# Patient Record
Sex: Male | Born: 1935 | Race: White | Hispanic: No | Marital: Married | Smoking: Former smoker
Health system: Southern US, Community
[De-identification: ages and names within clinical notes are randomized; demographics above are authoritative.]

## PROBLEM LIST (undated history)

## (undated) DIAGNOSIS — I10 Essential (primary) hypertension: Secondary | ICD-10-CM

## (undated) DIAGNOSIS — E785 Hyperlipidemia, unspecified: Secondary | ICD-10-CM

## (undated) HISTORY — DX: Hyperlipidemia, unspecified: E78.5

## (undated) HISTORY — DX: Essential (primary) hypertension: I10

## (undated) HISTORY — PX: SHOULDER SURGERY: SHX246

---

## 2011-10-28 DIAGNOSIS — K52832 Lymphocytic colitis: Secondary | ICD-10-CM

## 2011-10-28 HISTORY — DX: Lymphocytic colitis: K52.832

## 2014-07-07 DIAGNOSIS — M19019 Primary osteoarthritis, unspecified shoulder: Secondary | ICD-10-CM

## 2014-07-07 HISTORY — DX: Primary osteoarthritis, unspecified shoulder: M19.019

## 2015-05-16 DIAGNOSIS — I2510 Atherosclerotic heart disease of native coronary artery without angina pectoris: Secondary | ICD-10-CM | POA: Insufficient documentation

## 2015-05-16 HISTORY — DX: Atherosclerotic heart disease of native coronary artery without angina pectoris: I25.10

## 2015-05-17 DIAGNOSIS — E785 Hyperlipidemia, unspecified: Secondary | ICD-10-CM | POA: Insufficient documentation

## 2015-05-17 DIAGNOSIS — I10 Essential (primary) hypertension: Secondary | ICD-10-CM

## 2015-05-17 DIAGNOSIS — I739 Peripheral vascular disease, unspecified: Secondary | ICD-10-CM | POA: Insufficient documentation

## 2015-05-17 DIAGNOSIS — I714 Abdominal aortic aneurysm, without rupture, unspecified: Secondary | ICD-10-CM | POA: Insufficient documentation

## 2015-05-17 HISTORY — DX: Essential (primary) hypertension: I10

## 2015-05-17 HISTORY — DX: Abdominal aortic aneurysm, without rupture: I71.4

## 2015-05-17 HISTORY — DX: Peripheral vascular disease, unspecified: I73.9

## 2015-05-17 HISTORY — DX: Hyperlipidemia, unspecified: E78.5

## 2015-05-17 HISTORY — DX: Abdominal aortic aneurysm, without rupture, unspecified: I71.40

## 2015-11-01 DIAGNOSIS — Z860100 Personal history of colon polyps, unspecified: Secondary | ICD-10-CM | POA: Insufficient documentation

## 2015-11-01 DIAGNOSIS — K5909 Other constipation: Secondary | ICD-10-CM

## 2015-11-01 HISTORY — DX: Other constipation: K59.09

## 2015-11-01 HISTORY — DX: Personal history of colon polyps, unspecified: Z86.0100

## 2015-11-01 HISTORY — DX: Personal history of colonic polyps: Z86.010

## 2016-10-25 DIAGNOSIS — K219 Gastro-esophageal reflux disease without esophagitis: Secondary | ICD-10-CM

## 2016-10-25 DIAGNOSIS — A419 Sepsis, unspecified organism: Secondary | ICD-10-CM

## 2016-10-25 HISTORY — DX: Sepsis, unspecified organism: A41.9

## 2016-10-25 HISTORY — DX: Gastro-esophageal reflux disease without esophagitis: K21.9

## 2017-04-27 NOTE — Progress Notes (Signed)
u

## 2017-04-30 ENCOUNTER — Encounter: Payer: Self-pay | Admitting: Cardiology

## 2017-04-30 ENCOUNTER — Ambulatory Visit (INDEPENDENT_AMBULATORY_CARE_PROVIDER_SITE_OTHER): Payer: Medicare Other | Admitting: Cardiology

## 2017-04-30 VITALS — BP 140/70 | HR 72 | Resp 10 | Ht 68.0 in | Wt 177.8 lb

## 2017-04-30 DIAGNOSIS — I10 Essential (primary) hypertension: Secondary | ICD-10-CM

## 2017-04-30 DIAGNOSIS — I2510 Atherosclerotic heart disease of native coronary artery without angina pectoris: Secondary | ICD-10-CM | POA: Diagnosis not present

## 2017-04-30 DIAGNOSIS — I714 Abdominal aortic aneurysm, without rupture, unspecified: Secondary | ICD-10-CM

## 2017-04-30 DIAGNOSIS — I739 Peripheral vascular disease, unspecified: Secondary | ICD-10-CM | POA: Diagnosis not present

## 2017-04-30 DIAGNOSIS — E785 Hyperlipidemia, unspecified: Secondary | ICD-10-CM

## 2017-04-30 MED ORDER — ATORVASTATIN CALCIUM 20 MG PO TABS: 20.0000 mg | ORAL_TABLET | Freq: Every day | ORAL | 3 refills | Status: DC

## 2017-04-30 MED ORDER — CARVEDILOL 3.125 MG PO TABS: 6.2500 mg | ORAL_TABLET | Freq: Two times a day (BID) | ORAL | 3 refills | Status: DC

## 2017-04-30 MED ORDER — CARVEDILOL 6.25 MG PO TABS: 6.2500 mg | ORAL_TABLET | Freq: Two times a day (BID) | ORAL | 3 refills | Status: DC

## 2017-04-30 NOTE — Progress Notes (Signed)
Cardiology Office Note:    Date:  04/30/2017   ID:  Conrad [place], DOB 1933/09/12, MRN [id number]  PCP:  Andreas Blower., MD  Cardiologist:  Gypsy Balsam, MD    Referring MD: No ref. provider found   Chief Complaint  Patient presents with  . Follow-up  Coronary artery disease  History of Present Illness:    Stanley Cain is a 83 y.o. male  with coronary artery disease. He does have a constellation of her atypical symptoms again. Recently he did have a stress test which was normal. Quite well on the treadmill. Complaint of having fatigue and tiredness. He thinks is related to his medications. He already stopped Crestor recently feels better. He also takes metoprolol treated problem. I decided to switch Toprol to carvedilol 3.125 twice daily, will also switch his present Lipitor. I will see him back in my office in about 2 months to see how he does.  Past Medical History:  Diagnosis Date  . Hyperlipidemia   . Hypertension     Past Surgical History:  Procedure Laterality Date  . SHOULDER SURGERY      Current Medications: Current Meds  Medication Sig  . allopurinol (ZYLOPRIM) 100 MG tablet Take 100 mg by mouth.  Marland Kitchen aspirin EC 81 MG tablet Take 81 mg by mouth.  . clopidogrel (PLAVIX) 75 MG tablet Take 75 mg by mouth.  . clotrimazole-betamethasone (LOTRISONE) cream Apply topically.  . nitroGLYCERIN (NITROSTAT) 0.4 MG SL tablet Place 0.4 mg under the tongue.  . pantoprazole (PROTONIX) 40 MG tablet Take 1 tablet by mouth  daily  . ramipril (ALTACE) 5 MG capsule Take 1 capsule by mouth  daily  . rosuvastatin (CRESTOR) 20 MG tablet Take 20 mg by mouth.     Allergies:   Ciprofloxacin; Enalapril; Metaxalone; Niacin; and Penicillins   Social History   Social History  . Marital status: Married    Spouse name: N/A  . Number of children: N/A  . Years of education: N/A   Social History Main Topics  . Smoking status: Former Games developer  . Smokeless tobacco: Never Used  .  Alcohol use Yes  . Drug use: No  . Sexual activity: Not Asked   Other Topics Concern  . None   Social History Narrative  . None     Family History: The patient's family history includes Cancer in his father. ROS:   Please see the history of present illness.     All other systems reviewed and are negative.  EKGs/Labs/Other Studies Reviewed:      Recent Labs: No results found for requested labs within last 8760 hours.  Recent Lipid Panel No results found for: CHOL, TRIG, HDL, CHOLHDL, VLDL, LDLCALC, LDLDIRECT  Physical Exam:    VS:  BP 140/70   Pulse 72   Resp 10   Ht 5\' 8"  (1.727 m)   Wt 177 lb 12.8 oz (80.6 kg)   BMI 27.03 kg/m     Wt Readings from Last 3 Encounters:  04/30/17 177 lb 12.8 oz (80.6 kg)     GEN:  Well nourished, well developed in no acute distress HEENT: Normal NECK: No JVD; No carotid bruits LYMPHATICS: No lymphadenopathy CARDIAC: RRR, no murmurs, no rubs, no gallops RESPIRATORY:  Clear to auscultation without rales, wheezing or rhonchi  ABDOMEN: Soft, non-tender, non-distended MUSCULOSKELETAL:  No edema; No deformity  SKIN: Warm and dry LOWER EXTREMITIES: no swelling NEUROLOGIC:  Alert and oriented x 3 PSYCHIATRIC:  Normal affect   ASSESSMENT:  1. Abdominal aortic aneurysm (AAA), 30-34 mm diameter (HCC)   2. Coronary artery stenosis   3. Essential hypertension   4. Peripheral vascular disease (HCC)   5. Dyslipidemia    PLAN:    In order of problems listed above:  1. Coronary artery disease: Stable, recent stresses otherwise negative for exercise-induced myocardial ischemia these exercise loss. 2. Essential hypertension, he requested switching him from metoprolol to something different. I will put her vertigo 3.5 twice daily and watch his blood pressure. 3. Dyslipidemia: Discussion as above he wants to switch from present Lipitor which I will do.   Medication Adjustments/Labs and Tests Ordered: Current medicines are reviewed at  length with the patient today.  Concerns regarding medicines are outlined above.  No orders of the defined types were placed in this encounter.  Medication changes: No orders of the defined types were placed in this encounter.   Signed, Gypsy Balsamobert Reah Justo, MD  04/30/2017 8:54 AM    [facility]

## 2017-04-30 NOTE — Patient Instructions (Signed)
Medication Instructions:  Your physician has recommended you make the following change in your medication: Please stop the metoprolol and Crestor. Dr. Bing MatterKrasowski would like you to start carvedilol 3.125 mg twice daily and Lipitor 20 mg daily.    Labwork: None   Testing/Procedures: None   Follow-Up: Your physician recommends that you schedule a follow-up appointment in: 2 months   Any Other Special Instructions Will Be Listed Below (If Applicable).     If you need a refill on your cardiac medications before your next appointment, please call your pharmacy.

## 2017-06-02 ENCOUNTER — Ambulatory Visit (INDEPENDENT_AMBULATORY_CARE_PROVIDER_SITE_OTHER): Payer: Medicare Other | Admitting: Cardiology

## 2017-06-02 ENCOUNTER — Encounter: Payer: Self-pay | Admitting: Cardiology

## 2017-06-02 VITALS — BP 136/64 | HR 60 | Resp 12 | Ht 68.0 in | Wt 178.1 lb

## 2017-06-02 DIAGNOSIS — I714 Abdominal aortic aneurysm, without rupture, unspecified: Secondary | ICD-10-CM

## 2017-06-02 DIAGNOSIS — E785 Hyperlipidemia, unspecified: Secondary | ICD-10-CM | POA: Diagnosis not present

## 2017-06-02 DIAGNOSIS — I2510 Atherosclerotic heart disease of native coronary artery without angina pectoris: Secondary | ICD-10-CM | POA: Diagnosis not present

## 2017-06-02 DIAGNOSIS — I10 Essential (primary) hypertension: Secondary | ICD-10-CM

## 2017-06-02 NOTE — Patient Instructions (Addendum)
Medication Instructions:  Your physician recommends that you continue on your current medications as directed. Please refer to the Current Medication list given to you today.  Labwork: CBC, CMP, Sed-Rate, TSH, Vitamin B level. Dr. Bing MatterKrasowski wants to wait to check your cholesterol.   Testing/Procedures: None   Follow-Up: Your physician recommends that you schedule a follow-up appointment in: 1 month   Any Other Special Instructions Will Be Listed Below (If Applicable).     If you need a refill on your cardiac medications before your next appointment, please call your pharmacy.

## 2017-06-02 NOTE — Progress Notes (Signed)
Cardiology Office Note:    Date:  06/02/2017   ID:  Stanley Cain, DOB Sep 19, 1933, MRN [id number]  PCP:  Andreas Blowerabeza, Yuri M., MD  Cardiologist:  Gypsy Balsamobert Nanea Jared, MD    Referring MD: Andreas Blowerabeza, Yuri M., MD   Chief Complaint  Patient presents with  . 1 month follow up  I'm feeling weak and tired  History of Present Illness:    Stanley BurlingtonJerzy Roberge is a 83 y.o. male  with coronary artery disease. Recently chief complaint is being fatigue and tiredness. We tried to make some changes in his medication but that seems not to be helping. I switched him to different beta blocker however by mistake he was given wrong dose he was taking carvedilol 25 mg twice daily. He felt very weak and tired and eventually ended up switching to all the 25 mg at evening time. He also hold his statin which did not want improvement. I will contact his primary care physician tried to see what laboratory tests he had done. We may need to go back to basics checking for him for anemia, kidney function test liver function test as well as thyroid. I will also ask him to start taking all base at evening time) and in the morning.  Past Medical History:  Diagnosis Date  . Hyperlipidemia   . Hypertension     Past Surgical History:  Procedure Laterality Date  . SHOULDER SURGERY      Current Medications: Current Meds  Medication Sig  . allopurinol (ZYLOPRIM) 100 MG tablet Take 100 mg by mouth.  Marland Kitchen. aspirin EC 81 MG tablet Take 81 mg by mouth.  Marland Kitchen. atorvastatin (LIPITOR) 20 MG tablet Take 1 tablet (20 mg total) by mouth daily.  . clopidogrel (PLAVIX) 75 MG tablet Take 75 mg by mouth.  . clotrimazole-betamethasone (LOTRISONE) cream Apply topically.  . nitroGLYCERIN (NITROSTAT) 0.4 MG SL tablet Place 0.4 mg under the tongue.  . pantoprazole (PROTONIX) 40 MG tablet Take 1 tablet by mouth  daily  . ramipril (ALTACE) 5 MG capsule Take 1 capsule by mouth  daily     Allergies:   Ciprofloxacin; Enalapril; Metaxalone; Niacin; and  Penicillins   Social History   Social History  . Marital status: Married    Spouse name: N/A  . Number of children: N/A  . Years of education: N/A   Social History Main Topics  . Smoking status: Former Games developermoker  . Smokeless tobacco: Never Used  . Alcohol use Yes  . Drug use: No  . Sexual activity: Not Asked   Other Topics Concern  . None   Social History Narrative  . None     Family History: The patient's family history includes Cancer in his father. ROS:   Please see the history of present illness.    All 14 point review of systems negative except as described per history of present illness  EKGs/Labs/Other Studies Reviewed:      Recent Labs: No results found for requested labs within last 8760 hours.  Recent Lipid Panel No results found for: CHOL, TRIG, HDL, CHOLHDL, VLDL, LDLCALC, LDLDIRECT  Physical Exam:    VS:  BP 136/64   Pulse 60   Resp 12   Ht 5\' 8"  (1.727 m)   Wt 178 lb 1.9 oz (80.8 kg)   BMI 27.08 kg/m     Wt Readings from Last 3 Encounters:  06/02/17 178 lb 1.9 oz (80.8 kg)  04/30/17 177 lb 12.8 oz (80.6 kg)     GEN:  Well nourished, well developed in no acute distress HEENT: Normal NECK: No JVD; No carotid bruits LYMPHATICS: No lymphadenopathy CARDIAC: RRR, no murmurs, no rubs, no gallops RESPIRATORY:  Clear to auscultation without rales, wheezing or rhonchi  ABDOMEN: Soft, non-tender, non-distended MUSCULOSKELETAL:  No edema; No deformity  SKIN: Warm and dry LOWER EXTREMITIES: no swelling NEUROLOGIC:  Alert and oriented x 3 PSYCHIATRIC:  Normal affect   ASSESSMENT:    1. Coronary artery stenosis   2. Abdominal aortic aneurysm (AAA), 30-34 mm diameter (HCC)   3. Essential hypertension   4. Dyslipidemia    PLAN:    In order of problems listed above:  1. Coronary artery disease: Last stress test reviewed which was negative for exercise-induced ischemia. 2. Abdominal aneurysm: Stable. 3. Essential hypertension: Well-controlled  continue present management. 4. Dyslipidemia: Resume statin. 5. Weakness and fatigue: Plan as outlined above.   Medication Adjustments/Labs and Tests Ordered: Current medicines are reviewed at length with the patient today.  Concerns regarding medicines are outlined above.  No orders of the defined types were placed in this encounter.  Medication changes: No orders of the defined types were placed in this encounter.   Signed, Georgeanna Lea, MD, [facility] 06/02/2017 10:33 AM    [facility]

## 2017-06-03 ENCOUNTER — Telehealth: Payer: Self-pay

## 2017-06-03 ENCOUNTER — Other Ambulatory Visit: Payer: Self-pay

## 2017-06-03 LAB — CBC WITH DIFFERENTIAL/PLATELET
Basophils Absolute: 0.1 10*3/uL (ref 0.0–0.2)
Basos: 1 %
EOS (ABSOLUTE): 0.4 10*3/uL (ref 0.0–0.4)
Eos: 5 %
Hematocrit: 41.2 % (ref 37.5–51.0)
Hemoglobin: 13.7 g/dL (ref 13.0–17.7)
Immature Grans (Abs): 0 10*3/uL (ref 0.0–0.1)
Immature Granulocytes: 0 %
Lymphocytes Absolute: 2.4 10*3/uL (ref 0.7–3.1)
Lymphs: 29 %
MCH: 29.4 pg (ref 26.6–33.0)
MCHC: 33.3 g/dL (ref 31.5–35.7)
MCV: 88 fL (ref 79–97)
Monocytes Absolute: 0.6 10*3/uL (ref 0.1–0.9)
Monocytes: 8 %
Neutrophils Absolute: 4.7 10*3/uL (ref 1.4–7.0)
Neutrophils: 57 %
Platelets: 231 10*3/uL (ref 150–379)
RBC: 4.66 x10E6/uL (ref 4.14–5.80)
RDW: 13.6 % (ref 12.3–15.4)
WBC: 8.2 10*3/uL (ref 3.4–10.8)

## 2017-06-03 LAB — COMPREHENSIVE METABOLIC PANEL WITH GFR
ALT: 14 [IU]/L (ref 0–44)
AST: 16 [IU]/L (ref 0–40)
Albumin/Globulin Ratio: 1.8 (ref 1.2–2.2)
Albumin: 4.4 g/dL (ref 3.5–4.7)
Alkaline Phosphatase: 83 [IU]/L (ref 39–117)
BUN/Creatinine Ratio: 16 (ref 10–24)
BUN: 19 mg/dL (ref 8–27)
Bilirubin Total: 0.4 mg/dL (ref 0.0–1.2)
CO2: 24 mmol/L (ref 20–29)
Calcium: 9.7 mg/dL (ref 8.6–10.2)
Chloride: 101 mmol/L (ref 96–106)
Creatinine, Ser: 1.19 mg/dL (ref 0.76–1.27)
GFR calc Af Amer: 65 mL/min/{1.73_m2}
GFR calc non Af Amer: 57 mL/min/{1.73_m2} — ABNORMAL LOW
Globulin, Total: 2.5 g/dL (ref 1.5–4.5)
Glucose: 101 mg/dL — ABNORMAL HIGH (ref 65–99)
Potassium: 5 mmol/L (ref 3.5–5.2)
Sodium: 139 mmol/L (ref 134–144)
Total Protein: 6.9 g/dL (ref 6.0–8.5)

## 2017-06-03 LAB — VITAMIN B12: Vitamin B-12: 318 pg/mL (ref 232–1245)

## 2017-06-03 LAB — TSH: TSH: 2.07 u[IU]/mL (ref 0.450–4.500)

## 2017-06-03 LAB — SEDIMENTATION RATE: SED RATE: 3 mm/h (ref 0–30)

## 2017-06-03 LAB — VITAMIN D 25 HYDROXY (VIT D DEFICIENCY, FRACTURES): Vit D, 25-Hydroxy: 29.5 ng/mL — ABNORMAL LOW (ref 30.0–100.0)

## 2017-06-03 MED ORDER — VITAMIN D (ERGOCALCIFEROL) 1.25 MG (50000 UNIT) PO CAPS
50000.0000 [IU] | ORAL_CAPSULE | ORAL | 0 refills | Status: DC
Start: 2017-06-03 — End: 2019-12-15

## 2017-06-03 MED ORDER — VITAMIN D 50 MCG (2000 UT) PO CAPS: 2000.0000 [IU] | ORAL_CAPSULE | Freq: Every day | ORAL | 6 refills | Status: DC

## 2017-06-03 NOTE — Telephone Encounter (Signed)
S.w pt to advised of the results of test. Advised of medication start as well. Pt will call Dr. Bing MatterKrasowski for further clarification if needed.

## 2017-06-03 NOTE — Telephone Encounter (Signed)
-----   Message from Georgeanna Leaobert J Krasowski, MD sent at 06/03/2017 11:35 AM EDT ----- All labs are fine, but is D3 is low, start 50 000U po q week, after that 2000U a day

## 2017-06-08 ENCOUNTER — Telehealth: Payer: Self-pay

## 2017-06-08 DIAGNOSIS — R110 Nausea: Secondary | ICD-10-CM

## 2017-06-08 DIAGNOSIS — R140 Abdominal distension (gaseous): Secondary | ICD-10-CM

## 2017-06-08 NOTE — Telephone Encounter (Signed)
Message left for GI to discuss sooner appointment for pt.

## 2017-07-03 ENCOUNTER — Ambulatory Visit: Payer: Medicare Other | Admitting: Cardiology

## 2017-07-07 ENCOUNTER — Encounter: Payer: Self-pay | Admitting: Cardiology

## 2017-07-07 ENCOUNTER — Ambulatory Visit (INDEPENDENT_AMBULATORY_CARE_PROVIDER_SITE_OTHER): Payer: Medicare Other | Admitting: Cardiology

## 2017-07-07 VITALS — BP 130/74 | HR 72 | Resp 12 | Ht 68.0 in | Wt 174.0 lb

## 2017-07-07 DIAGNOSIS — I2510 Atherosclerotic heart disease of native coronary artery without angina pectoris: Secondary | ICD-10-CM

## 2017-07-07 DIAGNOSIS — I739 Peripheral vascular disease, unspecified: Secondary | ICD-10-CM | POA: Diagnosis not present

## 2017-07-07 DIAGNOSIS — E785 Hyperlipidemia, unspecified: Secondary | ICD-10-CM | POA: Diagnosis not present

## 2017-07-07 NOTE — Progress Notes (Signed)
Cardiology Office Note:    Date:  07/07/2017   ID:  Stanley BurlingtonJerzy Sunday, DOB 1933-02-15, MRN [id number]  PCP:  Andreas Blowerabeza, Yuri M., MD  Cardiologist:  Gypsy Balsamobert Elgie Maziarz, MD    Referring MD: Andreas Blowerabeza, Yuri M., MD   Chief Complaint  Patient presents with  . 1 month follow up  Feeling better  History of Present Illness:    Stanley Cain is a 83 y.o. male  with multiple issues. Recently he ended up going to the beach he stopped taking majority of his medications he also had 2-3 drinks of alcohol every day he walk on the beach and he felt good. Now he thinks that old problem that he is experiencing is related to medications. I asked him to start taking he is cholesterol medication the morning rather tender in the afternoon. Carvedilol he takes only one tablet at evening time. However I told him if he does not feel better by switching his cholesterol medication from evening to morning to discontinue carvedilol completely seated that make him feel better. Overall is a difficult situation. His wife actually came to me at the end of the visit and asked me if his possibilities having some depression and in mild p.m. that is a possibility her observations is very appropriate she noted that when he was at the beach he was walking a lot he was happy things were looking much better.  Past Medical History:  Diagnosis Date  . Hyperlipidemia   . Hypertension     Past Surgical History:  Procedure Laterality Date  . SHOULDER SURGERY      Current Medications: Current Meds  Medication Sig  . allopurinol (ZYLOPRIM) 100 MG tablet Take 100 mg by mouth.  Marland Kitchen. aspirin EC 81 MG tablet Take 81 mg by mouth.  Marland Kitchen. atorvastatin (LIPITOR) 20 MG tablet Take 1 tablet (20 mg total) by mouth daily.  . carvedilol (COREG) 3.125 MG tablet Take 2 tablets (6.25 mg total) by mouth 2 (two) times daily.  . Cholecalciferol (VITAMIN D) 2000 units CAPS Take 1 capsule (2,000 Units total) by mouth daily.  . clopidogrel (PLAVIX) 75 MG  tablet Take 75 mg by mouth.  . clotrimazole-betamethasone (LOTRISONE) cream Apply topically.  . nitroGLYCERIN (NITROSTAT) 0.4 MG SL tablet Place 0.4 mg under the tongue.  . pantoprazole (PROTONIX) 40 MG tablet Take 1 tablet by mouth  daily  . ramipril (ALTACE) 5 MG capsule Take 1 capsule by mouth  daily  . rosuvastatin (CRESTOR) 20 MG tablet Take 1 tablet by mouth daily.  . Vitamin D, Ergocalciferol, (DRISDOL) 50000 units CAPS capsule Take 1 capsule (50,000 Units total) by mouth every 7 (seven) days.     Allergies:   Ciprofloxacin; Enalapril; Metaxalone; Niacin; and Penicillins   Social History   Social History  . Marital status: Married    Spouse name: N/A  . Number of children: N/A  . Years of education: N/A   Social History Main Topics  . Smoking status: Former Games developermoker  . Smokeless tobacco: Never Used  . Alcohol use Yes  . Drug use: No  . Sexual activity: Not Asked   Other Topics Concern  . None   Social History Narrative  . None     Family History: The patient's family history includes Cancer in his father. ROS:   Please see the history of present illness.    All 14 point review of systems negative except as described per history of present illness  EKGs/Labs/Other Studies Reviewed:  Recent Labs: 06/02/2017: ALT 14; BUN 19; Creatinine, Ser 1.19; Hemoglobin 13.7; Platelets 231; Potassium 5.0; Sodium 139; TSH 2.070  Recent Lipid Panel No results found for: CHOL, TRIG, HDL, CHOLHDL, VLDL, LDLCALC, LDLDIRECT  Physical Exam:    VS:  BP 130/74   Pulse 72   Resp 12   Ht  (1.727 m)   Wt 174 lb (78.9 kg)   BMI 26.46 kg/m     Wt Readings from Last 3 Encounters:  07/07/17 174 lb (78.9 kg)  06/02/17 178 lb 1.9 oz (80.8 kg)  04/30/17 177 lb 12.8 oz (80.6 kg)     GEN:  Well nourished, well developed in no acute distress HEENT: Normal NECK: No JVD; No carotid bruits LYMPHATICS: No lymphadenopathy CARDIAC: RRR, no murmurs, no rubs, no  gallops RESPIRATORY:  Clear to auscultation without rales, wheezing or rhonchi  ABDOMEN: Soft, non-tender, non-distended MUSCULOSKELETAL:  No edema; No deformity  SKIN: Warm and dry LOWER EXTREMITIES: no swelling NEUROLOGIC:  Alert and oriented x 3 PSYCHIATRIC:  Normal affect   ASSESSMENT:    1. Coronary artery stenosis   2. Peripheral vascular disease (HCC)   3. Dyslipidemia    PLAN:    In order of problems listed above:  1. Coronary artery disease: Denies having any typical symptoms we'll continue present management. 2. Peripheral vascular disease stable we'll continue present management 3. Dyslipidemia: Difficulty tolerating medications   Medication Adjustments/Labs and Tests Ordered: Current medicines are reviewed at length with the patient today.  Concerns regarding medicines are outlined above.  No orders of the defined types were placed in this encounter.  Medication changes: No orders of the defined types were placed in this encounter.   Signed, Georgeanna Lea, MD, [facility] 07/07/2017 12:08 PM    [facility]

## 2017-07-07 NOTE — Patient Instructions (Signed)
Medication Instructions:  Your physician recommends that you continue on your current medications as directed. Please refer to the Current Medication list given to you today.  Labwork: None   Testing/Procedures: None   Follow-Up: Your physician recommends that you schedule a follow-up appointment in: 1 month   Any Other Special Instructions Will Be Listed Below (If Applicable).  Please note that any paperwork needing to be filled out by the provider will need to be addressed at the front desk prior to seeing the provider. Please note that any paperwork FMLA, Disability or other documents regarding health condition is subject to a $25.00 charge that must be received prior to completion of paperwork in the form of a money order or check.     If you need a refill on your cardiac medications before your next appointment, please call your pharmacy.  

## 2017-07-15 ENCOUNTER — Ambulatory Visit: Payer: Medicare Other | Admitting: Cardiology

## 2017-07-21 DIAGNOSIS — R531 Weakness: Secondary | ICD-10-CM

## 2017-07-21 HISTORY — DX: Weakness: R53.1

## 2017-08-06 ENCOUNTER — Ambulatory Visit (INDEPENDENT_AMBULATORY_CARE_PROVIDER_SITE_OTHER): Payer: Medicare Other | Admitting: Cardiology

## 2017-08-06 ENCOUNTER — Encounter: Payer: Self-pay | Admitting: Cardiology

## 2017-08-06 VITALS — BP 130/72 | HR 76 | Resp 14 | Ht 68.0 in | Wt 176.0 lb

## 2017-08-06 DIAGNOSIS — I10 Essential (primary) hypertension: Secondary | ICD-10-CM | POA: Diagnosis not present

## 2017-08-06 DIAGNOSIS — I714 Abdominal aortic aneurysm, without rupture, unspecified: Secondary | ICD-10-CM

## 2017-08-06 DIAGNOSIS — E785 Hyperlipidemia, unspecified: Secondary | ICD-10-CM | POA: Diagnosis not present

## 2017-08-06 DIAGNOSIS — I2510 Atherosclerotic heart disease of native coronary artery without angina pectoris: Secondary | ICD-10-CM

## 2017-08-06 NOTE — Progress Notes (Signed)
Cardiology Office Note:    Date:  08/06/2017   ID:  Stanley Cain, DOB 11-29-1932, MRN [id number]  PCP:  Stanley Cain., MD  Cardiologist:  Stanley Balsam, MD    Referring MD: Stanley Cain., MD   Chief Complaint  Patient presents with  . 1 month follow up  I'm still weak and tired  History of Present Illness:    Stanley Cain is a 83 y.o. male  with coronary artery disease and multiple nonspecific complaints. He said anytime he eats after that he will feel very poorly he will be tired and exhausted. He still try to exercise in the regular basis but sometimes he feels so bad that he cannot do it. Would after exercise he will feel weak and tired. No chest pain tightness squeezing pressure burning chest. I tried to switch his medications to seem to make any difference and there is some improvement but still far from being perfect. Quite extensive GI workup done including gastroscopy apparently all came negative. He was referred to rheumatology and waiting for the appointment. More I talk to him tomorrow I start thinking that it may be related to some psychological extremity may be depressed. He said he lately he cannot sleep well used to approximate and no problem with this couple weeks ago he went to the beach with his friends and was enjoying himself and had no problems. So this is something that needs to be investigated.  Past Medical History:  Diagnosis Date  . Hyperlipidemia   . Hypertension     Past Surgical History:  Procedure Laterality Date  . SHOULDER SURGERY      Current Medications: Current Meds  Medication Sig  . allopurinol (ZYLOPRIM) 100 MG tablet Take 100 mg by mouth.  Marland Kitchen aspirin EC 81 MG tablet Take 81 mg by mouth.  Marland Kitchen atorvastatin (LIPITOR) 20 MG tablet Take 1 tablet (20 mg total) by mouth daily.  . carvedilol (COREG) 3.125 MG tablet Take 2 tablets (6.25 mg total) by mouth 2 (two) times daily.  . Cholecalciferol (VITAMIN D) 2000 units CAPS Take 1 capsule  (2,000 Units total) by mouth daily.  . clopidogrel (PLAVIX) 75 MG tablet Take 75 mg by mouth.  . clotrimazole-betamethasone (LOTRISONE) cream Apply topically.  . nitroGLYCERIN (NITROSTAT) 0.4 MG SL tablet Place 0.4 mg under the tongue.  . pantoprazole (PROTONIX) 40 MG tablet Take 1 tablet by mouth  daily  . ramipril (ALTACE) 5 MG capsule Take 1 capsule by mouth  daily  . rosuvastatin (CRESTOR) 20 MG tablet Take 1 tablet by mouth daily.  . Vitamin D, Ergocalciferol, (DRISDOL) 50000 units CAPS capsule Take 1 capsule (50,000 Units total) by mouth every 7 (seven) days.     Allergies:   Ciprofloxacin; Enalapril; Metaxalone; Niacin; and Penicillins   Social History   Social History  . Marital status: Married    Spouse name: N/A  . Number of children: N/A  . Years of education: N/A   Social History Main Topics  . Smoking status: Former Games developer  . Smokeless tobacco: Never Used  . Alcohol use Yes  . Drug use: No  . Sexual activity: Not Asked   Other Topics Concern  . None   Social History Narrative  . None     Family History: The patient's family history includes Cancer in his father. ROS:   Please see the history of present illness.    All 14 point review of systems negative except as described per history of present illness  EKGs/Labs/Other Studies Reviewed:      Recent Labs: 06/02/2017: ALT 14; BUN 19; Creatinine, Ser 1.19; Hemoglobin 13.7; Platelets 231; Potassium 5.0; Sodium 139; TSH 2.070  Recent Lipid Panel No results found for: CHOL, TRIG, HDL, CHOLHDL, VLDL, LDLCALC, LDLDIRECT  Physical Exam:    VS:  BP 130/72   Pulse 76   Resp 14   Ht  (1.727 m)   Wt 176 lb (79.8 kg)   BMI 26.76 kg/m     Wt Readings from Last 3 Encounters:  08/06/17 176 lb (79.8 kg)  07/07/17 174 lb (78.9 kg)  06/02/17 178 lb 1.9 oz (80.8 kg)     GEN:  Well nourished, well developed in no acute distress HEENT: Normal NECK: No JVD; No carotid bruits LYMPHATICS: No  lymphadenopathy CARDIAC: RRR, no murmurs, no rubs, no gallops RESPIRATORY:  Clear to auscultation without rales, wheezing or rhonchi  ABDOMEN: Soft, non-tender, non-distended MUSCULOSKELETAL:  No edema; No deformity  SKIN: Warm and dry LOWER EXTREMITIES: no swelling NEUROLOGIC:  Alert and oriented x 3 PSYCHIATRIC:  Normal affect   ASSESSMENT:    1. Abdominal aortic aneurysm (AAA), 30-34 mm diameter (HCC)   2. Coronary artery stenosis   3. Essential hypertension   4. Dyslipidemia    PLAN:    In order of problems listed above:  1. Abdominal aneurysm: Stable we'll continue present management. 2. Coronary artery disease: Stable with no problems. Recent stress test negative. 3. Essential hypertension stable continue. 4. Dyslipidemia: Back on statin.   Medication Adjustments/Labs and Tests Ordered: Current medicines are reviewed at length with the patient today.  Concerns regarding medicines are outlined above.  No orders of the defined types were placed in this encounter.  Medication changes: No orders of the defined types were placed in this encounter.   Signed, Georgeanna Lea, MD, [facility] 08/06/2017 12:04 PM    [facility]

## 2017-08-06 NOTE — Patient Instructions (Addendum)
Medication Instructions:  Your physician recommends that you continue on your current medications as directed. Please refer to the Current Medication list given to you today.  Labwork: None Ordered  Testing/Procedures: None ordered  Follow-Up: Your physician recommends that you schedule a follow-up appointment in: 2 months with Dr. Bing Matter   Any Other Special Instructions Will Be Listed Below (If Applicable).     If you need a refill on your cardiac medications before your next appointment, please call your pharmacy.

## 2017-10-06 ENCOUNTER — Ambulatory Visit: Payer: Medicare Other | Admitting: Cardiology

## 2017-11-02 ENCOUNTER — Encounter: Payer: Self-pay | Admitting: Cardiology

## 2017-11-02 ENCOUNTER — Ambulatory Visit: Payer: Medicare Other | Admitting: Cardiology

## 2017-11-02 VITALS — BP 120/66 | HR 62 | Ht 68.0 in | Wt 176.0 lb

## 2017-11-02 DIAGNOSIS — E785 Hyperlipidemia, unspecified: Secondary | ICD-10-CM | POA: Diagnosis not present

## 2017-11-02 DIAGNOSIS — I10 Essential (primary) hypertension: Secondary | ICD-10-CM

## 2017-11-02 DIAGNOSIS — I2510 Atherosclerotic heart disease of native coronary artery without angina pectoris: Secondary | ICD-10-CM | POA: Diagnosis not present

## 2017-11-02 MED ORDER — RAMIPRIL 2.5 MG PO CAPS: 2.5000 mg | ORAL_CAPSULE | Freq: Two times a day (BID) | ORAL | 1 refills | Status: DC

## 2017-11-02 NOTE — Patient Instructions (Signed)
Medication Instructions:  Your physician has recommended you make the following change in your medication:  1) Start taking Ramipril 2.5 mg 1 tablet twice daily  Labwork: None ordered  Testing/Procedures: None ordered  Follow-Up: Your physician recommends that you schedule a follow-up appointment in: 3 months with Dr. Bing MatterKrasowski   Any Other Special Instructions Will Be Listed Below (If Applicable).     If you need a refill on your cardiac medications before your next appointment, please call your pharmacy.

## 2017-11-02 NOTE — Progress Notes (Signed)
Cardiology Office Note:    Date:  11/02/2017   ID:  Stanley Cain, DOB 12-06-1932, MRN [id number]  PCP:  Andreas Blower., MD  Cardiologist:  Gypsy Balsam, MD    Referring MD: Andreas Blower., MD   Chief Complaint  Patient presents with  . 2 month follow up  Doing fair  History of Present Illness:    Stanley Cain is a 84 y.o. male with multiple complaints.  He did see rheumatologist with consideration of polymyalgia rheumatica.  He was given some steroids however not much response to it.  He said initially he started feeling better but then again weakness and fatigue.  Denies having any chest pain leading complaint is weakness and fatigue especially happens after his exercises.  Actually stopped his ramipril.  Seems to be feeling better.  Still goes to gym on the regular basis.  Past Medical History:  Diagnosis Date  . Hyperlipidemia   . Hypertension     Past Surgical History:  Procedure Laterality Date  . SHOULDER SURGERY      Current Medications: Current Meds  Medication Sig  . allopurinol (ZYLOPRIM) 100 MG tablet Take 100 mg by mouth.  Marland Kitchen aspirin EC 81 MG tablet Take 81 mg by mouth.  Marland Kitchen atorvastatin (LIPITOR) 20 MG tablet Take 1 tablet (20 mg total) by mouth daily.  . carvedilol (COREG) 3.125 MG tablet Take 2 tablets (6.25 mg total) by mouth 2 (two) times daily.  . Cholecalciferol (VITAMIN D) 2000 units CAPS Take 1 capsule (2,000 Units total) by mouth daily.  . clopidogrel (PLAVIX) 75 MG tablet Take 75 mg by mouth.  . clotrimazole-betamethasone (LOTRISONE) cream Apply topically.  . nitroGLYCERIN (NITROSTAT) 0.4 MG SL tablet Place 0.4 mg under the tongue.  . pantoprazole (PROTONIX) 40 MG tablet Take 1 tablet by mouth  daily  . rosuvastatin (CRESTOR) 20 MG tablet Take 1 tablet by mouth daily.  . Vitamin D, Ergocalciferol, (DRISDOL) 50000 units CAPS capsule Take 1 capsule (50,000 Units total) by mouth every 7 (seven) days.     Allergies:   Ciprofloxacin;  Enalapril; Metaxalone; Niacin; and Penicillins   Social History   Socioeconomic History  . Marital status: Married    Spouse name: None  . Number of children: None  . Years of education: None  . Highest education level: None  Social Needs  . Financial resource strain: None  . Food insecurity - worry: None  . Food insecurity - inability: None  . Transportation needs - medical: None  . Transportation needs - non-medical: None  Occupational History  . None  Tobacco Use  . Smoking status: Former Games developer  . Smokeless tobacco: Never Used  Substance and Sexual Activity  . Alcohol use: Yes  . Drug use: No  . Sexual activity: None  Other Topics Concern  . None  Social History Narrative  . None     Family History: The patient's family history includes Cancer in his father. ROS:   Please see the history of present illness.    All 14 point review of systems negative except as described per history of present illness  EKGs/Labs/Other Studies Reviewed:      Recent Labs: 06/02/2017: ALT 14; BUN 19; Creatinine, Ser 1.19; Hemoglobin 13.7; Platelets 231; Potassium 5.0; Sodium 139; TSH 2.070  Recent Lipid Panel No results found for: CHOL, TRIG, HDL, CHOLHDL, VLDL, LDLCALC, LDLDIRECT  Physical Exam:    VS:  BP 120/66   Pulse 62   Ht 5\' 8"  (1.727 m)  Wt 176 lb (79.8 kg)   SpO2 96%   BMI 26.76 kg/m     Wt Readings from Last 3 Encounters:  11/02/17 176 lb (79.8 kg)  08/06/17 176 lb (79.8 kg)  07/07/17 174 lb (78.9 kg)     GEN:  Well nourished, well developed in no acute distress HEENT: Normal NECK: No JVD; No carotid bruits LYMPHATICS: No lymphadenopathy CARDIAC: RRR, no murmurs, no rubs, no gallops RESPIRATORY:  Clear to auscultation without rales, wheezing or rhonchi  ABDOMEN: Soft, non-tender, non-distended MUSCULOSKELETAL:  No edema; No deformity  SKIN: Warm and dry LOWER EXTREMITIES: no swelling NEUROLOGIC:  Alert and oriented x 3 PSYCHIATRIC:  Normal affect    ASSESSMENT:    1. Coronary artery stenosis   2. Essential hypertension   3. Dyslipidemia    PLAN:    In order of problems listed above:  1. Coronary artery stenosis: Appears to be stable denies having any symptoms from that standpoint of view. 2. Essential hypertension: Blood pressure appears to be well controlled.  I will try to put him on 2.5 from ramapril twice daily 3. Dyslipidemia continue with present management.   Medication Adjustments/Labs and Tests Ordered: Current medicines are reviewed at length with the patient today.  Concerns regarding medicines are outlined above.  No orders of the defined types were placed in this encounter.  Medication changes:  Meds ordered this encounter  Medications  . ramipril (ALTACE) 2.5 MG capsule    Sig: Take 1 capsule (2.5 mg total) by mouth 2 (two) times daily.    Dispense:  180 capsule    Refill:  1    Signed, Georgeanna Leaobert J. Taveon Enyeart, MD, [facility] 11/02/2017 2:42 PM    [facility]

## 2018-02-08 DIAGNOSIS — M48062 Spinal stenosis, lumbar region with neurogenic claudication: Secondary | ICD-10-CM

## 2018-02-08 HISTORY — DX: Spinal stenosis, lumbar region with neurogenic claudication: M48.062

## 2018-02-12 ENCOUNTER — Ambulatory Visit: Payer: Medicare Other | Admitting: Cardiology

## 2018-02-12 DIAGNOSIS — R0989 Other specified symptoms and signs involving the circulatory and respiratory systems: Secondary | ICD-10-CM

## 2018-03-18 ENCOUNTER — Encounter: Payer: Self-pay | Admitting: Cardiology

## 2018-03-18 ENCOUNTER — Ambulatory Visit: Payer: Medicare Other | Admitting: Cardiology

## 2018-03-18 VITALS — BP 130/80 | HR 64 | Ht 68.0 in | Wt 174.8 lb

## 2018-03-18 DIAGNOSIS — E785 Hyperlipidemia, unspecified: Secondary | ICD-10-CM

## 2018-03-18 DIAGNOSIS — I10 Essential (primary) hypertension: Secondary | ICD-10-CM

## 2018-03-18 DIAGNOSIS — I2510 Atherosclerotic heart disease of native coronary artery without angina pectoris: Secondary | ICD-10-CM | POA: Diagnosis not present

## 2018-03-18 DIAGNOSIS — I714 Abdominal aortic aneurysm, without rupture, unspecified: Secondary | ICD-10-CM

## 2018-03-18 NOTE — Progress Notes (Signed)
Cardiology Office Note:    Date:  03/18/2018   ID:  Stanley Cain, DOB [birth year], MRN [id number]  PCP:  Andreas Blower., MD  Cardiologist:  Gypsy Balsam, MD    Referring MD: Andreas Blower., MD   Chief Complaint  Patient presents with  . Follow-up  Overall doing fairly well.  History of Present Illness:    Stanley Cain is a 84 y.o. male coronary artery disease dyslipidemia aortic aneurysm.  Recently diagnosed with spinal stenosis there is some issue about potentially having surgery but he is not interested.  Have any chest pain tightness squeezing pressure burning chest likely no cardiac complaints but described with some strange episodes that he will get very weak.  It happens almost every single day in the morning he is usually fine but then in the afternoon he will develop those episodes of profound weakness.  So far we did not find a reason for those episodes.  Past Medical History:  Diagnosis Date  . Hyperlipidemia   . Hypertension     Past Surgical History:  Procedure Laterality Date  . SHOULDER SURGERY      Current Medications: Current Meds  Medication Sig  . allopurinol (ZYLOPRIM) 100 MG tablet Take 100 mg by mouth.  Marland Kitchen aspirin EC 81 MG tablet Take 81 mg by mouth.  Marland Kitchen atorvastatin (LIPITOR) 20 MG tablet Take 1 tablet (20 mg total) by mouth daily.  . carvedilol (COREG) 3.125 MG tablet Take 2 tablets (6.25 mg total) by mouth 2 (two) times daily.  . Cholecalciferol (VITAMIN D) 2000 units CAPS Take 1 capsule (2,000 Units total) by mouth daily.  . clopidogrel (PLAVIX) 75 MG tablet Take 75 mg by mouth.  . clotrimazole-betamethasone (LOTRISONE) cream Apply topically.  . nitroGLYCERIN (NITROSTAT) 0.4 MG SL tablet Place 0.4 mg under the tongue.  . pantoprazole (PROTONIX) 40 MG tablet Take 1 tablet by mouth  daily  . rosuvastatin (CRESTOR) 20 MG tablet Take 1 tablet by mouth daily.  . Vitamin D, Ergocalciferol, (DRISDOL) 50000 units CAPS capsule Take 1 capsule  (50,000 Units total) by mouth every 7 (seven) days.     Allergies:   Ciprofloxacin; Enalapril; Metaxalone; Niacin; and Penicillins   Social History   Socioeconomic History  . Marital status: Married    Spouse name: Not on file  . Number of children: Not on file  . Years of education: Not on file  . Highest education level: Not on file  Occupational History  . Not on file  Social Needs  . Financial resource strain: Not on file  . Food insecurity:    Worry: Not on file    Inability: Not on file  . Transportation needs:    Medical: Not on file    Non-medical: Not on file  Tobacco Use  . Smoking status: Former Games developer  . Smokeless tobacco: Never Used  Substance and Sexual Activity  . Alcohol use: Yes  . Drug use: No  . Sexual activity: Not on file  Lifestyle  . Physical activity:    Days per week: Not on file    Minutes per session: Not on file  . Stress: Not on file  Relationships  . Social connections:    Talks on phone: Not on file    Gets together: Not on file    Attends religious service: Not on file    Active member of club or organization: Not on file    Attends meetings of clubs or organizations: Not on file  Relationship status: Not on file  Other Topics Concern  . Not on file  Social History Narrative  . Not on file     Family History: The patient's family history includes Cancer in his father. ROS:   Please see the history of present illness.    All 14 point review of systems negative except as described per history of present illness  EKGs/Labs/Other Studies Reviewed:      Recent Labs: 06/02/2017: ALT 14; BUN 19; Creatinine, Ser 1.19; Hemoglobin 13.7; Platelets 231; Potassium 5.0; Sodium 139; TSH 2.070  Recent Lipid Panel No results found for: CHOL, TRIG, HDL, CHOLHDL, VLDL, LDLCALC, LDLDIRECT  Physical Exam:    VS:  BP 130/80   Pulse 64   Ht  (1.727 m)   Wt 174 lb 12.8 oz (79.3 kg)   SpO2 98%   BMI 26.58 kg/m     Wt Readings from  Last 3 Encounters:  03/18/18 174 lb 12.8 oz (79.3 kg)  11/02/17 176 lb (79.8 kg)  08/06/17 176 lb (79.8 kg)     GEN:  Well nourished, well developed in no acute distress HEENT: Normal NECK: No JVD; No carotid bruits LYMPHATICS: No lymphadenopathy CARDIAC: RRR, no murmurs, no rubs, no gallops RESPIRATORY:  Clear to auscultation without rales, wheezing or rhonchi  ABDOMEN: Soft, non-tender, non-distended MUSCULOSKELETAL:  No edema; No deformity  SKIN: Warm and dry LOWER EXTREMITIES: no swelling NEUROLOGIC:  Alert and oriented x 3 PSYCHIATRIC:  Normal affect   ASSESSMENT:    1. Essential hypertension   2. Dyslipidemia   3. Coronary artery stenosis   4. Abdominal aortic aneurysm (AAA), 30-34 mm diameter (HCC)    PLAN:    In order of problems listed above:  1. Essential hypertension: Blood pressure well controlled.  Since he does have though those episode of profound weakness ask him to check his blood pressure during those episodes.  I start thinking that he may have some autonomic neuropathy and dysautonomia after sepsis and that is what created problem. 2. Dyslipidemia he is scheduled to see his primary care physician next week he will have fasting lipid profile done. 3. Artery disease: Stable asymptomatic continue present management. 4. Dermatologic aneurysm stable   Medication Adjustments/Labs and Tests Ordered: Current medicines are reviewed at length with the patient today.  Concerns regarding medicines are outlined above.  No orders of the defined types were placed in this encounter.  Medication changes: No orders of the defined types were placed in this encounter.   Signed, Georgeanna Lea, MD, [facility] 03/18/2018 3:55 PM    [facility]

## 2018-03-18 NOTE — Patient Instructions (Signed)
Medication Instructions:  Your physician recommends that you continue on your current medications as directed. Please refer to the Current Medication list given to you today.   Labwork: None  Testing/Procedures: None  Follow-Up: Your physician wants you to follow-up in: 4 months. You will receive a reminder letter in the mail two months in advance. If you don't receive a letter, please call our office to schedule the follow-up appointment.   If you need a refill on your cardiac medications before your next appointment, please call your pharmacy.   Thank you for choosing [facility]! Kees Idrovo, RN [phone]    

## 2018-04-15 DIAGNOSIS — H2510 Age-related nuclear cataract, unspecified eye: Secondary | ICD-10-CM | POA: Insufficient documentation

## 2018-04-15 DIAGNOSIS — H25013 Cortical age-related cataract, bilateral: Secondary | ICD-10-CM

## 2018-04-15 HISTORY — DX: Age-related nuclear cataract, unspecified eye: H25.10

## 2018-04-15 HISTORY — DX: Cortical age-related cataract, bilateral: H25.013

## 2018-05-10 DIAGNOSIS — M4726 Other spondylosis with radiculopathy, lumbar region: Secondary | ICD-10-CM | POA: Insufficient documentation

## 2018-05-10 DIAGNOSIS — M533 Sacrococcygeal disorders, not elsewhere classified: Secondary | ICD-10-CM

## 2018-05-10 HISTORY — DX: Sacrococcygeal disorders, not elsewhere classified: M53.3

## 2018-05-10 HISTORY — DX: Other spondylosis with radiculopathy, lumbar region: M47.26

## 2018-07-15 ENCOUNTER — Telehealth: Payer: Self-pay | Admitting: Cardiology

## 2018-07-16 NOTE — Telephone Encounter (Signed)
error 

## 2018-07-19 ENCOUNTER — Telehealth: Payer: Self-pay | Admitting: *Deleted

## 2018-07-19 MED ORDER — CARVEDILOL 3.125 MG PO TABS: 6.2500 mg | ORAL_TABLET | Freq: Two times a day (BID) | ORAL | 3 refills | Status: DC

## 2018-07-19 NOTE — Telephone Encounter (Signed)
Pt called requesting [facility] [address] [place]

## 2018-07-27 DIAGNOSIS — R197 Diarrhea, unspecified: Secondary | ICD-10-CM

## 2018-07-27 HISTORY — DX: Diarrhea, unspecified: R19.7

## 2018-08-31 ENCOUNTER — Ambulatory Visit: Payer: Medicare Other | Admitting: Cardiology

## 2018-09-01 ENCOUNTER — Ambulatory Visit: Payer: Medicare Other | Admitting: Cardiology

## 2018-09-01 ENCOUNTER — Encounter: Payer: Self-pay | Admitting: Cardiology

## 2018-09-01 VITALS — BP 130/62 | HR 61 | Ht 68.0 in | Wt 170.4 lb

## 2018-09-01 DIAGNOSIS — I2510 Atherosclerotic heart disease of native coronary artery without angina pectoris: Secondary | ICD-10-CM

## 2018-09-01 DIAGNOSIS — I714 Abdominal aortic aneurysm, without rupture, unspecified: Secondary | ICD-10-CM

## 2018-09-01 DIAGNOSIS — I10 Essential (primary) hypertension: Secondary | ICD-10-CM | POA: Diagnosis not present

## 2018-09-01 DIAGNOSIS — I739 Peripheral vascular disease, unspecified: Secondary | ICD-10-CM

## 2018-09-01 DIAGNOSIS — E785 Hyperlipidemia, unspecified: Secondary | ICD-10-CM

## 2018-09-01 MED ORDER — AZITHROMYCIN 250 MG PO TABS: ORAL_TABLET | ORAL | 0 refills | Status: DC

## 2018-09-01 NOTE — Progress Notes (Signed)
Cardiology Office Note:    Date:  09/01/2018   ID:  Stanley Cain, DOB 11-Apr-1933, MRN [id number]  PCP:  Andreas Blower., MD  Cardiologist:  Gypsy Balsam, MD    Referring MD: Andreas Blower., MD   Chief Complaint  Patient presents with  . Follow-up  I have a lot of issues  History of Present Illness:    Stanley Cain is a 84 y.o. male with coronary artery disease, peripheral vascular disease also history of abdominal aortic aneurysm measuring 35 mm.  Comes today to my office for follow-up looks like the biggest struggle that he has right now is problem with diarrhea and constipation.  He is trying all different medication to try to manage this he goes to the GI doctor as well as the internal medicine doctor with some help.  Denies having any chest pain tightness squeezing pressure burning chest.  Still trying to exercise however have difficulty getting tired quite easily.  No swelling of lower extremities complain of having chronic back pain as well.  Also within last couple days he complained of having stuffy nose cough with greenish sputum.  Denies having any fever chills but did not check his temperature.  Past Medical History:  Diagnosis Date  . Hyperlipidemia   . Hypertension     Past Surgical History:  Procedure Laterality Date  . SHOULDER SURGERY      Current Medications: Current Meds  Medication Sig  . allopurinol (ZYLOPRIM) 100 MG tablet Take 100 mg by mouth.  Marland Kitchen aspirin EC 81 MG tablet Take 81 mg by mouth.  Marland Kitchen atorvastatin (LIPITOR) 20 MG tablet Take 1 tablet (20 mg total) by mouth daily.  . carvedilol (COREG) 3.125 MG tablet Take 2 tablets (6.25 mg total) by mouth 2 (two) times daily.  . Cholecalciferol (VITAMIN D) 2000 units CAPS Take 1 capsule (2,000 Units total) by mouth daily.  . clopidogrel (PLAVIX) 75 MG tablet Take 75 mg by mouth.  . clotrimazole-betamethasone (LOTRISONE) cream Apply topically.  . nitroGLYCERIN (NITROSTAT) 0.4 MG SL tablet Place 0.4  mg under the tongue.  . pantoprazole (PROTONIX) 40 MG tablet Take 1 tablet by mouth  daily  . ramipril (ALTACE) 2.5 MG capsule Take 1 capsule (2.5 mg total) by mouth 2 (two) times daily.  . rosuvastatin (CRESTOR) 20 MG tablet Take 1 tablet by mouth daily.  . Vitamin D, Ergocalciferol, (DRISDOL) 50000 units CAPS capsule Take 1 capsule (50,000 Units total) by mouth every 7 (seven) days.     Allergies:   Ciprofloxacin; Enalapril; Metaxalone; Niacin; and Penicillins   Social History   Socioeconomic History  . Marital status: Married    Spouse name: Not on file  . Number of children: Not on file  . Years of education: Not on file  . Highest education level: Not on file  Occupational History  . Not on file  Social Needs  . Financial resource strain: Not on file  . Food insecurity:    Worry: Not on file    Inability: Not on file  . Transportation needs:    Medical: Not on file    Non-medical: Not on file  Tobacco Use  . Smoking status: Former Games developer  . Smokeless tobacco: Never Used  Substance and Sexual Activity  . Alcohol use: Yes  . Drug use: No  . Sexual activity: Not on file  Lifestyle  . Physical activity:    Days per week: Not on file    Minutes per session: Not on file  .  Stress: Not on file  Relationships  . Social connections:    Talks on phone: Not on file    Gets together: Not on file    Attends religious service: Not on file    Active member of club or organization: Not on file    Attends meetings of clubs or organizations: Not on file    Relationship status: Not on file  Other Topics Concern  . Not on file  Social History Narrative  . Not on file     Family History: The patient's family history includes Cancer in his father. ROS:   Please see the history of present illness.    All 14 point review of systems negative except as described per history of present illness  EKGs/Labs/Other Studies Reviewed:      Recent Labs: No results found for  requested labs within last 8760 hours.  Recent Lipid Panel No results found for: CHOL, TRIG, HDL, CHOLHDL, VLDL, LDLCALC, LDLDIRECT  Physical Exam:    VS:  BP 130/62   Pulse 61   Ht 5\' 8"  (1.727 m)   Wt 170 lb 6.4 oz (77.3 kg)   SpO2 98%   BMI 25.91 kg/m     Wt Readings from Last 3 Encounters:  09/01/18 170 lb 6.4 oz (77.3 kg)  03/18/18 174 lb 12.8 oz (79.3 kg)  11/02/17 176 lb (79.8 kg)     GEN:  Well nourished, well developed in no acute distress HEENT: Normal NECK: No JVD; No carotid bruits LYMPHATICS: No lymphadenopathy CARDIAC: RRR, no murmurs, no rubs, no gallops RESPIRATORY:  Clear to auscultation without rales, wheezing or rhonchi  ABDOMEN: Soft, non-tender, non-distended MUSCULOSKELETAL:  No edema; No deformity  SKIN: Warm and dry LOWER EXTREMITIES: no swelling NEUROLOGIC:  Alert and oriented x 3 PSYCHIATRIC:  Normal affect   ASSESSMENT:    1. Abdominal aortic aneurysm (AAA), 30-34 mm diameter (HCC)   2. Coronary artery stenosis   3. Essential hypertension   4. Peripheral vascular disease (HCC)   5. Dyslipidemia    PLAN:    In order of problems listed above:  1. Abdominal ultrasound was without rupture.  We will schedule him to have abdominal ultrasound to check on the size of the aneurysm. 2. Coronary artery disease appears to be asymptomatic from that point review continue present management. 3. Essential hypertension blood pressure well controlled we will continue present management. 4. Peripheral vascular disease stable we will check his abdominal aortic aneurysm carotid ultrasounds reviewed did not show any critical stenosis. 5. Dyslipidemia we will continue with current dose of statins. 6. Bronchitis-like symptoms with pharyngitis.  I will give him prescription for Z-Pak.   Medication Adjustments/Labs and Tests Ordered: Current medicines are reviewed at length with the patient today.  Concerns regarding medicines are outlined above.  Orders Placed  This Encounter  Procedures  . US Abdomen Complete   Medication changes:  Meds ordered this encounter  Medications  . azithromycin (ZITHROMAX) 250 MG tablet    Sig: Take 2 tablet on day 1, followed by 1 tablet daily until complete    Dispense:  6 each    Refill:  0    Signed, Georgeanna Lea, MD, [facility] 09/01/2018 12:13 PM    [facility]

## 2018-09-01 NOTE — Patient Instructions (Signed)
Medication Instructions:  Your physician has recommended you make the following change in your medication:  START taking your Azithromycin. You will take 2 tablets on the first day and then 2 tablets on days 2 through 5  If you need a refill on your cardiac medications before your next appointment, please call your pharmacy.   Lab work: None ordered If you have labs (blood work) drawn today and your tests are completely normal, you will receive your results only by: Marland Kitchen MyChart Message (if you have MyChart) OR . A paper copy in the mail If you have any lab test that is abnormal or we need to change your treatment, we will call you to review the results.  Testing/Procedures: Your physician has requested that you have an abdominal aorta duplex. During this test, an ultrasound is used to evaluate the aorta. Allow 30 minutes for this exam. Do not eat after midnight the day before and avoid carbonated beverages  Follow-Up: At [facility], you and your health needs are our priority.  As part of our continuing mission to provide you with exceptional heart care, we have created designated Provider Care Teams.  These Care Teams include your primary Cardiologist (physician) and Advanced Practice Providers (APPs -  Physician Assistants and Nurse Practitioners) who all work together to provide you with the care you need, when you need it. You will need a follow up appointment in 4 months.  Please call our office 2 months in advance to schedule this appointment.  You may see Gypsy Balsam or another member of our [employer] Provider Team in [place]: Norman Herrlich, MD . Belva Crome, MD  Any Other Special Instructions Will Be Listed Below (If Applicable).

## 2018-09-03 ENCOUNTER — Ambulatory Visit: Payer: Medicare Other | Admitting: Cardiology

## 2018-09-09 ENCOUNTER — Ambulatory Visit (HOSPITAL_BASED_OUTPATIENT_CLINIC_OR_DEPARTMENT_OTHER)
Admission: RE | Admit: 2018-09-09 | Discharge: 2018-09-09 | Disposition: A | Discharge status: Home / Self Care | Payer: Medicare Other | Source: Ambulatory Visit | Attending: Cardiology | Admitting: Cardiology

## 2018-09-09 DIAGNOSIS — I714 Abdominal aortic aneurysm, without rupture, unspecified: Secondary | ICD-10-CM

## 2018-11-24 ENCOUNTER — Ambulatory Visit: Payer: Medicare Other | Admitting: Cardiology

## 2018-11-24 ENCOUNTER — Encounter: Payer: Self-pay | Admitting: *Deleted

## 2018-11-24 ENCOUNTER — Encounter: Payer: Self-pay | Admitting: Cardiology

## 2018-11-24 VITALS — BP 124/60 | HR 66 | Wt 167.0 lb

## 2018-11-24 DIAGNOSIS — I714 Abdominal aortic aneurysm, without rupture, unspecified: Secondary | ICD-10-CM

## 2018-11-24 DIAGNOSIS — I2510 Atherosclerotic heart disease of native coronary artery without angina pectoris: Secondary | ICD-10-CM | POA: Diagnosis not present

## 2018-11-24 DIAGNOSIS — E785 Hyperlipidemia, unspecified: Secondary | ICD-10-CM

## 2018-11-24 DIAGNOSIS — I10 Essential (primary) hypertension: Secondary | ICD-10-CM | POA: Diagnosis not present

## 2018-11-24 MED ORDER — LISINOPRIL 2.5 MG PO TABS: 2.5000 mg | ORAL_TABLET | Freq: Every day | ORAL | 3 refills | Status: DC

## 2018-11-24 NOTE — Progress Notes (Signed)
Cardiology Office Note:    Date:  11/24/2018   ID:  Stanley Cain, DOB 09/18/1933, MRN [id number]  PCP:  Andreas Blowerabeza, Yuri M., MD  Cardiologist:  Gypsy Balsamobert Juleon Narang, MD    Referring MD: Andreas Blowerabeza, Yuri M., MD   Chief Complaint  Patient presents with  . Feeling weak  Doing fair cardiac wise  History of Present Illness:    Stanley BurlingtonJerzy Sperling is a 85 y.o. male is a complicated patient with a lot of nonspecific symptoms the biggest complaint he has is episodes of profound weakness he said usually he gets up in the morning take his medication after that he feels very weak and tired.  He check his blood pressure heart rate everything looks good we did quite extensive evaluation for it it was unrevealing also complained of having intermittent constipation with diarrhea quite extensive GI work-up has been done recently he ended up having CT of his abdomen which did not show any significant finding he does have abdominal aortic arteries which is only mild.  Today he complaining of having still episodes of weakness and fatigue.  He thinks it is related to ramipril.  He stopped ramipril however for a few days and does not feel any better he insisted on switching this to a different medication therefore I will put him on lisinopril 2.5 mg daily.  As a part of evaluation I will ask him to have an echocardiogram to reassess left ventricular ejection fraction.  Past Medical History:  Diagnosis Date  . Hyperlipidemia   . Hypertension     Past Surgical History:  Procedure Laterality Date  . SHOULDER SURGERY      Current Medications: Current Meds  Medication Sig  . allopurinol (ZYLOPRIM) 100 MG tablet Take 100 mg by mouth.  Marland Kitchen. aspirin EC 81 MG tablet Take 81 mg by mouth.  Marland Kitchen. atorvastatin (LIPITOR) 20 MG tablet Take 1 tablet (20 mg total) by mouth daily.  . carvedilol (COREG) 3.125 MG tablet Take 2 tablets (6.25 mg total) by mouth 2 (two) times daily.  . Cholecalciferol (VITAMIN D) 2000 units CAPS Take 1  capsule (2,000 Units total) by mouth daily.  . clopidogrel (PLAVIX) 75 MG tablet Take 75 mg by mouth.  . clotrimazole-betamethasone (LOTRISONE) cream Apply topically.  . nitroGLYCERIN (NITROSTAT) 0.4 MG SL tablet Place 0.4 mg under the tongue.  . pantoprazole (PROTONIX) 40 MG tablet Take 1 tablet by mouth  daily  . ramipril (ALTACE) 2.5 MG capsule Take 1 capsule (2.5 mg total) by mouth 2 (two) times daily.  . rosuvastatin (CRESTOR) 20 MG tablet Take 1 tablet by mouth daily.  . Vitamin D, Ergocalciferol, (DRISDOL) 50000 units CAPS capsule Take 1 capsule (50,000 Units total) by mouth every 7 (seven) days.     Allergies:   Ciprofloxacin; Enalapril; Metaxalone; Niacin; and Penicillins   Social History   Socioeconomic History  . Marital status: Married    Spouse name: Not on file  . Number of children: Not on file  . Years of education: Not on file  . Highest education level: Not on file  Occupational History  . Not on file  Social Needs  . Financial resource strain: Not on file  . Food insecurity:    Worry: Not on file    Inability: Not on file  . Transportation needs:    Medical: Not on file    Non-medical: Not on file  Tobacco Use  . Smoking status: Former Games developermoker  . Smokeless tobacco: Never Used  Substance and Sexual  Activity  . Alcohol use: Yes  . Drug use: No  . Sexual activity: Not on file  Lifestyle  . Physical activity:    Days per week: Not on file    Minutes per session: Not on file  . Stress: Not on file  Relationships  . Social connections:    Talks on phone: Not on file    Gets together: Not on file    Attends religious service: Not on file    Active member of club or organization: Not on file    Attends meetings of clubs or organizations: Not on file    Relationship status: Not on file  Other Topics Concern  . Not on file  Social History Narrative  . Not on file     Family History: The patient's family history includes Cancer in his father. ROS:     Please see the history of present illness.    All 14 point review of systems negative except as described per history of present illness  EKGs/Labs/Other Studies Reviewed:      Recent Labs: No results found for requested labs within last 8760 hours.  Recent Lipid Panel No results found for: CHOL, TRIG, HDL, CHOLHDL, VLDL, LDLCALC, LDLDIRECT  Physical Exam:    VS:  BP 124/60   Pulse 66   Wt 167 lb (75.8 kg)   SpO2 98%   BMI 25.39 kg/m     Wt Readings from Last 3 Encounters:  11/24/18 167 lb (75.8 kg)  09/01/18 170 lb 6.4 oz (77.3 kg)  03/18/18 174 lb 12.8 oz (79.3 kg)     GEN:  Well nourished, well developed in no acute distress HEENT: Normal NECK: No JVD; No carotid bruits LYMPHATICS: No lymphadenopathy CARDIAC: RRR, no murmurs, no rubs, no gallops RESPIRATORY:  Clear to auscultation without rales, wheezing or rhonchi  ABDOMEN: Soft, non-tender, non-distended MUSCULOSKELETAL:  No edema; No deformity  SKIN: Warm and dry LOWER EXTREMITIES: no swelling NEUROLOGIC:  Alert and oriented x 3 PSYCHIATRIC:  Normal affect   ASSESSMENT:    1. Coronary artery stenosis   2. Essential hypertension   3. Abdominal aortic aneurysm (AAA), 30-34 mm diameter (HCC)   4. Dyslipidemia    PLAN:    In order of problems listed above:  1. Coronary artery disease stable stress test in 2018-.  Denies having any chest pain tightness squeezing pressure burning chest. 2. Essential hypertension blood pressure well controlled continue present management.  Will switch ramipril to lisinopril 3. Abdominal aortic aneurysm.  Stable only mild continue monitoring 4. Dyslipidemia he is taking rosuvastatin which I will continue he complained that when he takes rosuvastatin at evening time he have a lot of cough I advised him to take in the morning.   Medication Adjustments/Labs and Tests Ordered: Current medicines are reviewed at length with the patient today.  Concerns regarding medicines are  outlined above.  No orders of the defined types were placed in this encounter.  Medication changes: No orders of the defined types were placed in this encounter.   Signed, Georgeanna Lea, MD, [facility] 11/24/2018 12:00 PM    [facility]

## 2018-11-24 NOTE — Patient Instructions (Signed)
Medication Instructions:  Your physician has recommended you make the following change in your medication:   STOP ramapril  START lisinopril 2.5 mg: Take 1 tablet daily  If you need a refill on your cardiac medications before your next appointment, please call your pharmacy.   Lab work: None  If you have labs (blood work) drawn today and your tests are completely normal, you will receive your results only by: Marland Kitchen. MyChart Message (if you have MyChart) OR . A paper copy in the mail If you have any lab test that is abnormal or we need to change your treatment, we will call you to review the results.  Testing/Procedures: Your physician has requested that you have an echocardiogram. Echocardiography is a painless test that uses sound waves to create images of your heart. It provides your doctor with information about the size and shape of your heart and how well your heart's chambers and valves are working. This procedure takes approximately one hour. There are no restrictions for this procedure.   Follow-Up: At [facility], you and your health needs are our priority.  As part of our continuing mission to provide you with exceptional heart care, we have created designated Provider Care Teams.  These Care Teams include your primary Cardiologist (physician) and Advanced Practice Providers (APPs -  Physician Assistants and Nurse Practitioners) who all work together to provide you with the care you need, when you need it. You will need a follow up appointment in 3 months.  Please call our office 2 months in advance to schedule this appointment.       Echocardiogram An echocardiogram is a procedure that uses painless sound waves (ultrasound) to produce an image of the heart. Images from an echocardiogram can provide important information about:  Signs of coronary artery disease (CAD).  Aneurysm detection. An aneurysm is a weak or damaged part of an artery wall that bulges out from the normal  force of blood pumping through the body.  Heart size and shape. Changes in the size or shape of the heart can be associated with certain conditions, including heart failure, aneurysm, and CAD.  Heart muscle function.  Heart valve function.  Signs of a past heart attack.  Fluid buildup around the heart.  Thickening of the heart muscle.  A tumor or infectious growth around the heart valves. Tell a health care provider about:  Any allergies you have.  All medicines you are taking, including vitamins, herbs, eye drops, creams, and over-the-counter medicines.  Any blood disorders you have.  Any surgeries you have had.  Any medical conditions you have.  Whether you are pregnant or may be pregnant. What are the risks? Generally, this is a safe procedure. However, problems may occur, including:  Allergic reaction to dye (contrast) that may be used during the procedure. What happens before the procedure? No specific preparation is needed. You may eat and drink normally. What happens during the procedure?   An IV tube may be inserted into one of your veins.  You may receive contrast through this tube. A contrast is an injection that improves the quality of the pictures from your heart.  A gel will be applied to your chest.  A wand-like tool (transducer) will be moved over your chest. The gel will help to transmit the sound waves from the transducer.  The sound waves will harmlessly bounce off of your heart to allow the heart images to be captured in real-time motion. The images will be recorded  on a computer. The procedure may vary among health care providers and hospitals. What happens after the procedure?  You may return to your normal, everyday life, including diet, activities, and medicines, unless your health care provider tells you not to do that. Summary  An echocardiogram is a procedure that uses painless sound waves (ultrasound) to produce an image of the  heart.  Images from an echocardiogram can provide important information about the size and shape of your heart, heart muscle function, heart valve function, and fluid buildup around your heart.  You do not need to do anything to prepare before this procedure. You may eat and drink normally.  After the echocardiogram is completed, you may return to your normal, everyday life, unless your health care provider tells you not to do that. This information is not intended to replace advice given to you by your health care provider. Make sure you discuss any questions you have with your health care provider. Document Released: 10/10/2000 Document Revised: 11/15/2016 Document Reviewed: 11/15/2016 Elsevier Interactive Patient Education  2019 Elsevier Inc.   Lisinopril tablets What is this medicine? LISINOPRIL (lyse IN oh pril) is an ACE inhibitor. This medicine is used to treat high blood pressure and heart failure. It is also used to protect the heart immediately after a heart attack. This medicine may be used for other purposes; ask your health care provider or pharmacist if you have questions. COMMON BRAND NAME(S): Prinivil, Zestril What should I tell my health care provider before I take this medicine? They need to know if you have any of these conditions: -diabetes -heart or blood vessel disease -kidney disease -low blood pressure -previous swelling of the tongue, face, or lips with difficulty breathing, difficulty swallowing, hoarseness, or tightening of the throat -an unusual or allergic reaction to lisinopril, other ACE inhibitors, insect venom, foods, dyes, or preservatives -pregnant or trying to get pregnant -breast-feeding How should I use this medicine? Take this medicine by mouth with a glass of water. Follow the directions on your prescription label. You may take this medicine with or without food. If it upsets your stomach, take it with food. Take your medicine at regular intervals.  Do not take it more often than directed. Do not stop taking except on your doctor's advice. Talk to your pediatrician regarding the use of this medicine in children. Special care may be needed. While this drug may be prescribed for children as young as 31 years of age for selected conditions, precautions do apply. Overdosage: If you think you have taken too much of this medicine contact a poison control center or emergency room at once. NOTE: This medicine is only for you. Do not share this medicine with others. What if I miss a dose? If you miss a dose, take it as soon as you can. If it is almost time for your next dose, take only that dose. Do not take double or extra doses. What may interact with this medicine? Do not take this medicine with any of the following medications: -hymenoptera venom -sacubitril; valsartan This medicines may also interact with the following medications: -aliskiren -angiotensin receptor blockers, like losartan or valsartan -certain medicines for diabetes -diuretics -everolimus -gold compounds -lithium -NSAIDs, medicines for pain and inflammation, like ibuprofen or naproxen -potassium salts or supplements -salt substitutes -sirolimus -temsirolimus This list may not describe all possible interactions. Give your health care provider a list of all the medicines, herbs, non-prescription drugs, or dietary supplements you use. Also tell them if you  smoke, drink alcohol, or use illegal drugs. Some items may interact with your medicine. What should I watch for while using this medicine? Visit your doctor or health care professional for regular check ups. Check your blood pressure as directed. Ask your doctor what your blood pressure should be, and when you should contact him or her. Do not treat yourself for coughs, colds, or pain while you are using this medicine without asking your doctor or health care professional for advice. Some ingredients may increase your blood  pressure. Women should inform their doctor if they wish to become pregnant or think they might be pregnant. There is a potential for serious side effects to an unborn child. Talk to your health care professional or pharmacist for more information. Check with your doctor or health care professional if you get an attack of severe diarrhea, nausea and vomiting, or if you sweat a lot. The loss of too much body fluid can make it dangerous for you to take this medicine. You may get drowsy or dizzy. Do not drive, use machinery, or do anything that needs mental alertness until you know how this drug affects you. Do not stand or sit up quickly, especially if you are an older patient. This reduces the risk of dizzy or fainting spells. Alcohol can make you more drowsy and dizzy. Avoid alcoholic drinks. Avoid salt substitutes unless you are told otherwise by your doctor or health care professional. What side effects may I notice from receiving this medicine? Side effects that you should report to your doctor or health care professional as soon as possible: -allergic reactions like skin rash, itching or hives, swelling of the hands, feet, face, lips, throat, or tongue -breathing problems -signs and symptoms of kidney injury like trouble passing urine or change in the amount of urine -signs and symptoms of increased potassium like muscle weakness; chest pain; or fast, irregular heartbeat -signs and symptoms of liver injury like dark yellow or brown urine; general ill feeling or flu-like symptoms; light-colored stools; loss of appetite; nausea; right upper belly pain; unusually weak or tired; yellowing of the eyes or skin -signs and symptoms of low blood pressure like dizziness; feeling faint or lightheaded, falls; unusually weak or tired -stomach pain with or without nausea and vomiting Side effects that usually do not require medical attention (report to your doctor or health care professional if they continue or  are bothersome): -changes in taste -cough -dizziness -fever -headache -sensitivity to light This list may not describe all possible side effects. Call your doctor for medical advice about side effects. You may report side effects to FDA at 1-800-FDA-1088. Where should I keep my medicine? Keep out of the reach of children. Store at room temperature between 15 and 30 degrees C (59 and 86 degrees F). Protect from moisture. Keep container tightly closed. Throw away any unused medicine after the expiration date. NOTE: This sheet is a summary. It may not cover all possible information. If you have questions about this medicine, talk to your doctor, pharmacist, or health care provider.  2019 Elsevier/Gold Standard (2015-12-03 12:52:35)

## 2018-12-14 ENCOUNTER — Ambulatory Visit (HOSPITAL_BASED_OUTPATIENT_CLINIC_OR_DEPARTMENT_OTHER)
Admission: RE | Admit: 2018-12-14 | Discharge: 2018-12-14 | Disposition: A | Discharge status: Home / Self Care | Payer: Medicare Other | Source: Ambulatory Visit | Attending: Cardiology | Admitting: Cardiology

## 2018-12-14 DIAGNOSIS — I714 Abdominal aortic aneurysm, without rupture: Secondary | ICD-10-CM | POA: Diagnosis not present

## 2018-12-14 DIAGNOSIS — I10 Essential (primary) hypertension: Secondary | ICD-10-CM

## 2018-12-14 NOTE — Progress Notes (Signed)
  Echocardiogram 2D Echocardiogram has been performed.  Janalyn Harder 12/14/2018, 10:53 AM

## 2019-03-07 ENCOUNTER — Other Ambulatory Visit: Payer: Self-pay | Admitting: Cardiology

## 2019-03-07 NOTE — Telephone Encounter (Signed)
Rx refill sent to pharmacy. 

## 2019-05-05 ENCOUNTER — Other Ambulatory Visit: Payer: Self-pay

## 2019-05-05 ENCOUNTER — Ambulatory Visit (INDEPENDENT_AMBULATORY_CARE_PROVIDER_SITE_OTHER): Payer: Medicare Other | Admitting: Cardiology

## 2019-05-05 ENCOUNTER — Encounter: Payer: Self-pay | Admitting: Cardiology

## 2019-05-05 VITALS — BP 150/70 | HR 72 | Wt 170.1 lb

## 2019-05-05 DIAGNOSIS — I714 Abdominal aortic aneurysm, without rupture, unspecified: Secondary | ICD-10-CM

## 2019-05-05 DIAGNOSIS — I10 Essential (primary) hypertension: Secondary | ICD-10-CM

## 2019-05-05 DIAGNOSIS — E785 Hyperlipidemia, unspecified: Secondary | ICD-10-CM

## 2019-05-05 DIAGNOSIS — I2510 Atherosclerotic heart disease of native coronary artery without angina pectoris: Secondary | ICD-10-CM

## 2019-05-05 NOTE — Patient Instructions (Signed)
Medication Instructions:  Your physician recommends that you continue on your current medications as directed. Please refer to the Current Medication list given to you today.   If you need a refill on your cardiac medications before your next appointment, please call your pharmacy.   Lab work:  Your physician recommends that you return for lab work in: TODAY TSH, CMP  If you have labs (blood work) drawn today and your tests are completely normal, you will receive your results only by: Marland Kitchen MyChart Message (if you have MyChart) OR . A paper copy in the mail If you have any lab test that is abnormal or we need to change your treatment, we will call you to review the results.  Testing/Procedures: None  Follow-Up: At [facility], you and your health needs are our priority.  As part of our continuing mission to provide you with exceptional heart care, we have created designated Provider Care Teams.  These Care Teams include your primary Cardiologist (physician) and Advanced Practice Providers (APPs -  Physician Assistants and Nurse Practitioners) who all work together to provide you with the care you need, when you need it. You will need a follow up appointment in 3 months. Any Other Special Instructions Will Be Listed Below (If Applicable).

## 2019-05-05 NOTE — Progress Notes (Signed)
Cardiology Office Note:    Date:  05/05/2019   ID:  Stanley Cain Calia, DOB 1933/03/03, MRN [id number]  PCP:  Andreas Blowerabeza, Yuri M., MD  Cardiologist:  Gypsy Balsamobert , MD    Referring MD: Andreas Blowerabeza, Yuri M., MD   Chief Complaint  Patient presents with  . Follow-up  I feel very weak and tired  History of Present Illness:    Stanley Cain Quirion is a 85 y.o. male with past medical history significant for coronary artery disease, hypertension, dyslipidemia, abdominal arctic aneurysm.  Comes today to my office.  Because he got a lot of complaints biggest one appears to be the fact that he is getting very weak and tired he said he goes for walks like usually he does his routine morning exercises is usually and after that he is completely wiped out.  He does not have any chest pain tightness pressure burning squeezing in the chest just profound fatigue and tiredness.  Also described to have problem breathing through his nose when he laying down flat at night.  He thinks he got allergies.  Past Medical History:  Diagnosis Date  . Hyperlipidemia   . Hypertension     Past Surgical History:  Procedure Laterality Date  . SHOULDER SURGERY      Current Medications: Current Meds  Medication Sig  . aspirin EC 81 MG tablet Take 81 mg by mouth.  Marland Kitchen. atorvastatin (LIPITOR) 20 MG tablet Take 1 tablet (20 mg total) by mouth daily.  . carvedilol (COREG) 3.125 MG tablet Take 2 tablets (6.25 mg total) by mouth 2 (two) times daily.  . Cholecalciferol (VITAMIN D) 2000 units CAPS Take 1 capsule (2,000 Units total) by mouth daily.  . clopidogrel (PLAVIX) 75 MG tablet Take 75 mg by mouth.  . clotrimazole-betamethasone (LOTRISONE) cream Apply topically.  Marland Kitchen. lisinopril (ZESTRIL) 2.5 MG tablet TAKE 1 TABLET(2.5 MG) BY MOUTH DAILY  . nitroGLYCERIN (NITROSTAT) 0.4 MG SL tablet Place 0.4 mg under the tongue.  . pantoprazole (PROTONIX) 40 MG tablet Take 1 tablet by mouth  daily  . rosuvastatin (CRESTOR) 20 MG tablet Take 1  tablet by mouth daily.  . Vitamin D, Ergocalciferol, (DRISDOL) 50000 units CAPS capsule Take 1 capsule (50,000 Units total) by mouth every 7 (seven) days.     Allergies:   Ciprofloxacin, Enalapril, Metaxalone, Niacin, and Penicillins   Social History   Socioeconomic History  . Marital status: Married    Spouse name: Not on file  . Number of children: Not on file  . Years of education: Not on file  . Highest education level: Not on file  Occupational History  . Not on file  Social Needs  . Financial resource strain: Not on file  . Food insecurity    Worry: Not on file    Inability: Not on file  . Transportation needs    Medical: Not on file    Non-medical: Not on file  Tobacco Use  . Smoking status: Former Games developermoker  . Smokeless tobacco: Never Used  Substance and Sexual Activity  . Alcohol use: Yes  . Drug use: No  . Sexual activity: Not on file  Lifestyle  . Physical activity    Days per week: Not on file    Minutes per session: Not on file  . Stress: Not on file  Relationships  . Social Musicianconnections    Talks on phone: Not on file    Gets together: Not on file    Attends religious service: Not on file  Active member of club or organization: Not on file    Attends meetings of clubs or organizations: Not on file    Relationship status: Not on file  Other Topics Concern  . Not on file  Social History Narrative  . Not on file     Family History: The patient's family history includes Cancer in his father. ROS:   Please see the history of present illness.    All 14 point review of systems negative except as described per history of present illness  EKGs/Labs/Other Studies Reviewed:      Recent Labs: No results found for requested labs within last 8760 hours.  Recent Lipid Panel No results found for: CHOL, TRIG, HDL, CHOLHDL, VLDL, LDLCALC, LDLDIRECT  Physical Exam:    VS:  BP (!) 150/70   Pulse 72   Wt 170 lb 1.9 oz (77.2 kg)   SpO2 98%   BMI 25.87 kg/m      Wt Readings from Last 3 Encounters:  05/05/19 170 lb 1.9 oz (77.2 kg)  11/24/18 167 lb (75.8 kg)  09/01/18 170 lb 6.4 oz (77.3 kg)     GEN:  Well nourished, well developed in no acute distress HEENT: Normal NECK: No JVD; No carotid bruits LYMPHATICS: No lymphadenopathy CARDIAC: RRR, no murmurs, no rubs, no gallops RESPIRATORY:  Clear to auscultation without rales, wheezing or rhonchi  ABDOMEN: Soft, non-tender, non-distended MUSCULOSKELETAL:  No edema; No deformity  SKIN: Warm and dry LOWER EXTREMITIES: no swelling NEUROLOGIC:  Alert and oriented x 3 PSYCHIATRIC:  Normal affect   ASSESSMENT:    1. Coronary artery stenosis   2. Essential hypertension   3. Abdominal aortic aneurysm (AAA), 30-34 mm diameter (HCC)   4. Dyslipidemia    PLAN:    In order of problems listed above:  1. Coronary artery disease.  Stable from that point review denies having any symptoms that would suggest activation of the problem.  He does not remember what medication he takes he supposed to call us later today or tomorrow to tell us exactly what medication he is on so we can make proper choices. 2. Essential hypertension blood pressure elevated today but he did not take his medications today he said that he if he does not take medication he feels well I again asked him to bring copy of all his medication to me so can adjust his medications. 3. Abdominal aneurysm we will schedule him to have abdominal ultrasound. 4. Dyslipidemia again he does not remember what medication he takes he will call us to tell us what this medication is.  According to our chart he is supposed to be taking atorvastatin.   Medication Adjustments/Labs and Tests Ordered: Current medicines are reviewed at length with the patient today.  Concerns regarding medicines are outlined above.  Orders Placed This Encounter  Procedures  . Comprehensive Metabolic Panel (CMET)  . TSH   Medication changes: No orders of the defined  types were placed in this encounter.   Signed, Park Liter, MD, [facility] 05/05/2019 2:49 PM    [place]

## 2019-05-06 LAB — COMPREHENSIVE METABOLIC PANEL
ALT: 13 IU/L (ref 0–44)
AST: 16 IU/L (ref 0–40)
Albumin/Globulin Ratio: 2.3 — ABNORMAL HIGH (ref 1.2–2.2)
Albumin: 4.8 g/dL — ABNORMAL HIGH (ref 3.6–4.6)
Alkaline Phosphatase: 76 IU/L (ref 39–117)
BUN/Creatinine Ratio: 15 (ref 10–24)
BUN: 16 mg/dL (ref 8–27)
Bilirubin Total: 0.3 mg/dL (ref 0.0–1.2)
CO2: 23 mmol/L (ref 20–29)
Calcium: 9.5 mg/dL (ref 8.6–10.2)
Chloride: 106 mmol/L (ref 96–106)
Creatinine, Ser: 1.08 mg/dL (ref 0.76–1.27)
GFR calc Af Amer: 72 mL/min/{1.73_m2} (ref >59)
GFR calc non Af Amer: 63 mL/min/{1.73_m2} (ref >59)
Globulin, Total: 2.1 g/dL (ref 1.5–4.5)
Glucose: 142 mg/dL — ABNORMAL HIGH (ref 65–99)
Potassium: 4.6 mmol/L (ref 3.5–5.2)
Sodium: 144 mmol/L (ref 134–144)
Total Protein: 6.9 g/dL (ref 6.0–8.5)

## 2019-05-06 LAB — TSH: TSH: 2.04 u[IU]/mL (ref 0.450–4.500)

## 2019-05-13 DIAGNOSIS — E538 Deficiency of other specified B group vitamins: Secondary | ICD-10-CM

## 2019-05-13 HISTORY — DX: Deficiency of other specified B group vitamins: E53.8

## 2019-07-19 ENCOUNTER — Telehealth: Payer: Self-pay | Admitting: Cardiology

## 2019-07-19 MED ORDER — LISINOPRIL 2.5 MG PO TABS: ORAL_TABLET | ORAL | 3 refills | Status: DC

## 2019-07-19 NOTE — Telephone Encounter (Signed)
°*  STAT* If patient is at the pharmacy, call can be transferred to refill team.   1. Which medications need to be refilled? (please list name of each medication and dose if known) Lisinopril 2.5mg  tablets  2. Which pharmacy/location (including street and city if local pharmacy) is medication to be sent to?[facility] [address] [place]  3. Do they need a 30 day or 90 day supply? [place]

## 2019-07-19 NOTE — Telephone Encounter (Signed)
Refill sent in to pharmacy 

## 2019-08-05 ENCOUNTER — Ambulatory Visit: Payer: Medicare Other | Admitting: Cardiology

## 2019-08-12 ENCOUNTER — Other Ambulatory Visit: Payer: Self-pay

## 2019-08-12 ENCOUNTER — Encounter: Payer: Self-pay | Admitting: Cardiology

## 2019-08-12 ENCOUNTER — Ambulatory Visit (INDEPENDENT_AMBULATORY_CARE_PROVIDER_SITE_OTHER): Payer: Medicare Other | Admitting: Cardiology

## 2019-08-12 VITALS — BP 146/60 | HR 76 | Ht 68.0 in | Wt 174.8 lb

## 2019-08-12 DIAGNOSIS — I10 Essential (primary) hypertension: Secondary | ICD-10-CM

## 2019-08-12 DIAGNOSIS — I714 Abdominal aortic aneurysm, without rupture, unspecified: Secondary | ICD-10-CM

## 2019-08-12 DIAGNOSIS — E785 Hyperlipidemia, unspecified: Secondary | ICD-10-CM | POA: Diagnosis not present

## 2019-08-12 DIAGNOSIS — I2510 Atherosclerotic heart disease of native coronary artery without angina pectoris: Secondary | ICD-10-CM | POA: Diagnosis not present

## 2019-08-12 DIAGNOSIS — R079 Chest pain, unspecified: Secondary | ICD-10-CM

## 2019-08-12 NOTE — Progress Notes (Signed)
Cardiology Office Note:    Date:  08/12/2019   ID:  Stanley Cain, DOB 1933/04/03, MRN [id number]  PCP:  Kristopher Glee., MD  Cardiologist:  Jenne Campus, MD    Referring MD: Kristopher Glee., MD   Chief Complaint  Patient presents with   Follow-up  Not feeling well  History of Present Illness:    Stanley Cain is a 85 y.o. male with a lot of nonspecific complaint at this is ongoing problem the biggest issue that he has is the fact he got profound weakness from time to time he said he gets up in the morning is fine go for a walk approximately half of the usual walk that he used to and after that he just feels profoundly weak and tired.  Denies having any typical chest pain tightness squeezing pressure burning chest.  Quite extensive evaluation has been done including multiple laboratory tests none of this is revealing.  Past Medical History:  Diagnosis Date   Hyperlipidemia    Hypertension     Past Surgical History:  Procedure Laterality Date   SHOULDER SURGERY      Current Medications: Current Meds  Medication Sig   aspirin EC 81 MG tablet Take 81 mg by mouth.   atorvastatin (LIPITOR) 20 MG tablet Take 1 tablet (20 mg total) by mouth daily.   carvedilol (COREG) 3.125 MG tablet Take 2 tablets (6.25 mg total) by mouth 2 (two) times daily.   Cholecalciferol (VITAMIN D) 2000 units CAPS Take 1 capsule (2,000 Units total) by mouth daily.   clopidogrel (PLAVIX) 75 MG tablet Take 75 mg by mouth.   lisinopril (ZESTRIL) 2.5 MG tablet TAKE 1 TABLET(2.5 MG) BY MOUTH DAILY   nitroGLYCERIN (NITROSTAT) 0.4 MG SL tablet Place 0.4 mg under the tongue.   pantoprazole (PROTONIX) 40 MG tablet Take 1 tablet by mouth  daily   Vitamin D, Ergocalciferol, (DRISDOL) 50000 units CAPS capsule Take 1 capsule (50,000 Units total) by mouth every 7 (seven) days.     Allergies:   Ciprofloxacin, Enalapril, Metaxalone, Niacin, and Penicillins   Social History   Socioeconomic  History   Marital status: Married    Spouse name: Not on file   Number of children: Not on file   Years of education: Not on file   Highest education level: Not on file  Occupational History   Not on file  Social Needs   Financial resource strain: Not on file   Food insecurity    Worry: Not on file    Inability: Not on file   Transportation needs    Medical: Not on file    Non-medical: Not on file  Tobacco Use   Smoking status: Former Smoker   Smokeless tobacco: Never Used  Substance and Sexual Activity   Alcohol use: Yes   Drug use: No   Sexual activity: Not on file  Lifestyle   Physical activity    Days per week: Not on file    Minutes per session: Not on file   Stress: Not on file  Relationships   Social connections    Talks on phone: Not on file    Gets together: Not on file    Attends religious service: Not on file    Active member of club or organization: Not on file    Attends meetings of clubs or organizations: Not on file    Relationship status: Not on file  Other Topics Concern   Not on file  Social History Narrative  Not on file     Family History: The patient's family history includes Cancer in his father. ROS:   Please see the history of present illness.    All 14 point review of systems negative except as described per history of present illness  EKGs/Labs/Other Studies Reviewed:      Recent Labs: 05/05/2019: ALT 13; BUN 16; Creatinine, Ser 1.08; Potassium 4.6; Sodium 144; TSH 2.040  Recent Lipid Panel No results found for: CHOL, TRIG, HDL, CHOLHDL, VLDL, LDLCALC, LDLDIRECT  Physical Exam:    VS:  BP (!) 146/60    Pulse 76    Ht 5\' 8"  (1.727 m)    Wt 174 lb 12.8 oz (79.3 kg)    SpO2 96%    BMI 26.58 kg/m     Wt Readings from Last 3 Encounters:  08/12/19 174 lb 12.8 oz (79.3 kg)  05/05/19 170 lb 1.9 oz (77.2 kg)  11/24/18 167 lb (75.8 kg)     GEN:  Well nourished, well developed in no acute distress HEENT:  Normal NECK: No JVD; No carotid bruits LYMPHATICS: No lymphadenopathy CARDIAC: RRR, no murmurs, no rubs, no gallops RESPIRATORY:  Clear to auscultation without rales, wheezing or rhonchi  ABDOMEN: Soft, non-tender, non-distended MUSCULOSKELETAL:  No edema; No deformity  SKIN: Warm and dry LOWER EXTREMITIES: no swelling NEUROLOGIC:  Alert and oriented x 3 PSYCHIATRIC:  Normal affect   ASSESSMENT:    1. Coronary artery stenosis   2. Abdominal aortic aneurysm (AAA), 30-34 mm diameter (HCC)   3. Essential hypertension   4. Dyslipidemia    PLAN:    In order of problems listed above:  1. Coronary artery disease.  He does not have any typical symptoms however I think we need to look for potential angina equivalent.  I will schedule him to have Lexiscan.  He also thinks that may be Plavix giving him problems therefore asked him to stop Plavix continue with aspirin. 2. History of abdominal arteries only small. 3. We will check abdominal ultrasound.  Also refer him back to his stomach doctor.  He does have some constipation which bothers him a lot.  Also some symptoms from GI tract that may need to be worked out again. 4. Essential hypertension blood pressure slightly elevated today.  He keep checking blood pressure at home and usually good at home 5. Dyslipidemia taking statin.  Overall very complicated situation.  We will do stress test to rule out ischemia as a potential source for his symptoms.  See him back in about 2 months.  I recommended for him to follow-up with his GI specialist   Medication Adjustments/Labs and Tests Ordered: Current medicines are reviewed at length with the patient today.  Concerns regarding medicines are outlined above.  No orders of the defined types were placed in this encounter.  Medication changes: No orders of the defined types were placed in this encounter.   Signed, 11/26/18, MD, [facility] 08/12/2019 2:06 PM    [facility]

## 2019-08-12 NOTE — Patient Instructions (Addendum)
Medication Instructions:  Your physician recommends that you continue on your current medications as directed. Please refer to the Current Medication list given to you today.  *If you need a refill on your cardiac medications before your next appointment, please call your pharmacy*  Lab Work: None Ordered .  Testing/Procedures: Your physician has requested that you have a lexiscan myoview. For further information please visit [email or web address] Please follow instruction sheet, as given.   Follow-Up: At [facility], you and your health needs are our priority.  As part of our continuing mission to provide you with exceptional heart care, we have created designated Provider Care Teams.  These Care Teams include your primary Cardiologist (physician) and Advanced Practice Providers (APPs -  Physician Assistants and Nurse Practitioners) who all work together to provide you with the care you need, when you need it.  Your next appointment:   2 months  The format for your next appointment:   In Person  Provider:   You may see Dr. Bing Matter or the following Advanced Practice Provider on your designated Care Team:    Gillian Shields, FNP   Cardiac Nuclear Scan A cardiac nuclear scan is a test that measures blood flow to the heart when a person is resting and when he or she is exercising. The test looks for problems such as:  Not enough blood reaching a portion of the heart.  The heart muscle not working normally. You may need this test if:  You have heart disease.  You have had abnormal lab results.  You have had heart surgery or a balloon procedure to open up blocked arteries (angioplasty).  You have chest pain.  You have shortness of breath. In this test, a radioactive dye (tracer) is injected into your bloodstream. After the tracer has traveled to your heart, an imaging device is used to measure how much of the tracer is absorbed by or distributed to various areas of your  heart. This procedure is usually done at a hospital and takes 2-4 hours. Tell a health care provider about:  Any allergies you have.  All medicines you are taking, including vitamins, herbs, eye drops, creams, and over-the-counter medicines.  Any problems you or family members have had with anesthetic medicines.  Any blood disorders you have.  Any surgeries you have had.  Any medical conditions you have.  Whether you are pregnant or may be pregnant. What are the risks? Generally, this is a safe procedure. However, problems may occur, including:  Serious chest pain and heart attack. This is only a risk if the stress portion of the test is done.  Rapid heartbeat.  Sensation of warmth in your chest. This usually passes quickly.  Allergic reaction to the tracer. What happens before the procedure?  Ask your health care provider about changing or stopping your regular medicines. This is especially important if you are taking diabetes medicines or blood thinners.  Follow instructions from your health care provider about eating or drinking restrictions.  Remove your jewelry on the day of the procedure. What happens during the procedure?  An IV will be inserted into one of your veins.  Your health care provider will inject a small amount of radioactive tracer through the IV.  You will wait for 20-40 minutes while the tracer travels through your bloodstream.  Your heart activity will be monitored with an electrocardiogram (ECG).  You will lie down on an exam table.  Images of your heart will be taken for about 15-20 minutes.  You may also have a stress test. For this test, one of the following may be done: ? You will exercise on a treadmill or stationary bike. While you exercise, your heart's activity will be monitored with an ECG, and your blood pressure will be checked. ? You will be given medicines that will increase blood flow to parts of your heart. This is done if you are  unable to exercise.  When blood flow to your heart has peaked, a tracer will again be injected through the IV.  After 20-40 minutes, you will get back on the exam table and have more images taken of your heart.  Depending on the type of tracer used, scans may need to be repeated 3-4 hours later.  Your IV line will be removed when the procedure is over. The procedure may vary among health care providers and hospitals. What happens after the procedure?  Unless your health care provider tells you otherwise, you may return to your normal schedule, including diet, activities, and medicines.  Unless your health care provider tells you otherwise, you may increase your fluid intake. This will help to flush the contrast dye from your body. Drink enough fluid to keep your urine pale yellow.  Ask your health care provider, or the department that is doing the test: ? When will my results be ready? ? How will I get my results? Summary  A cardiac nuclear scan measures the blood flow to the heart when a person is resting and when he or she is exercising.  Tell your health care provider if you are pregnant.  Before the procedure, ask your health care provider about changing or stopping your regular medicines. This is especially important if you are taking diabetes medicines or blood thinners.  After the procedure, unless your health care provider tells you otherwise, increase your fluid intake. This will help flush the contrast dye from your body.  After the procedure, unless your health care provider tells you otherwise, you may return to your normal schedule, including diet, activities, and medicines. This information is not intended to replace advice given to you by your health care provider. Make sure you discuss any questions you have with your health care provider. Document Released: 11/07/2004 Document Revised: 03/29/2018 Document Reviewed: 03/29/2018 Elsevier Patient Education  2020 [employer].

## 2019-08-15 ENCOUNTER — Telehealth (HOSPITAL_COMMUNITY): Payer: Self-pay | Admitting: *Deleted

## 2019-08-15 NOTE — Telephone Encounter (Signed)
Patient given detailed instructions per Myocardial Perfusion Study Information Sheet for the test on 08/17/2019 at 1045. Patient notified to arrive 15 minutes early and that it is imperative to arrive on time for appointment to keep from having the test rescheduled.  If you need to cancel or reschedule your appointment, please call the office within 24 hours of your appointment. . Patient verbalized understanding.Stanley Cain, Ranae Palms No my chart

## 2019-08-17 ENCOUNTER — Ambulatory Visit (HOSPITAL_COMMUNITY): Payer: Medicare Other | Attending: Cardiovascular Disease

## 2019-08-17 ENCOUNTER — Other Ambulatory Visit: Payer: Self-pay

## 2019-08-17 VITALS — Ht 68.0 in | Wt 174.0 lb

## 2019-08-17 DIAGNOSIS — R079 Chest pain, unspecified: Secondary | ICD-10-CM | POA: Insufficient documentation

## 2019-08-17 LAB — MYOCARDIAL PERFUSION IMAGING
LV dias vol: 89 mL (ref 62–150)
LV sys vol: 26 mL
Peak HR: 77 {beats}/min
Rest HR: 59 {beats}/min
SDS: 1
SRS: 0
SSS: 1
TID: 1.13

## 2019-08-17 MED ORDER — TECHNETIUM TC 99M TETROFOSMIN IV KIT
10.1000 | PACK | Freq: Once | INTRAVENOUS | Status: AC | PRN
  Administered 2019-08-17: 10.1 via INTRAVENOUS
  Filled 2019-08-17: qty 11

## 2019-08-17 MED ORDER — TECHNETIUM TC 99M TETROFOSMIN IV KIT
31.6000 | PACK | Freq: Once | INTRAVENOUS | Status: AC | PRN
  Administered 2019-08-17: 31.6 via INTRAVENOUS
  Filled 2019-08-17: qty 32

## 2019-08-17 MED ORDER — REGADENOSON 0.4 MG/5ML IV SOLN
0.4000 mg | Freq: Once | INTRAVENOUS | Status: AC
  Administered 2019-08-17: 0.4 mg via INTRAVENOUS

## 2019-09-14 DIAGNOSIS — F411 Generalized anxiety disorder: Secondary | ICD-10-CM | POA: Insufficient documentation

## 2019-09-14 HISTORY — DX: Generalized anxiety disorder: F41.1

## 2019-09-30 ENCOUNTER — Other Ambulatory Visit: Payer: Self-pay

## 2019-09-30 ENCOUNTER — Ambulatory Visit: Payer: Medicare Other | Admitting: Cardiology

## 2019-09-30 VITALS — BP 134/66 | HR 62 | Ht 68.0 in | Wt 173.4 lb

## 2019-09-30 DIAGNOSIS — I714 Abdominal aortic aneurysm, without rupture, unspecified: Secondary | ICD-10-CM

## 2019-09-30 DIAGNOSIS — I739 Peripheral vascular disease, unspecified: Secondary | ICD-10-CM | POA: Diagnosis not present

## 2019-09-30 DIAGNOSIS — I10 Essential (primary) hypertension: Secondary | ICD-10-CM

## 2019-09-30 DIAGNOSIS — I2510 Atherosclerotic heart disease of native coronary artery without angina pectoris: Secondary | ICD-10-CM

## 2019-09-30 DIAGNOSIS — E785 Hyperlipidemia, unspecified: Secondary | ICD-10-CM

## 2019-09-30 NOTE — Progress Notes (Signed)
Cardiology Office Note:    Date:  09/30/2019   ID:  Stanley Cain, DOB Jul 22, 1933, MRN [id number]  PCP:  Kristopher Glee., MD  Cardiologist:  Jenne Campus, MD    Referring MD: Kristopher Glee., MD   Chief Complaint  Patient presents with  . Follow-up  Not feeling well  History of Present Illness:    Stanley Cain is a 85 y.o. male with past medical history significant for coronary artery disease, abdominal arctic arteries, dyslipidemia comes today to my office for follow-up and again he has a lot of complaints biggest complaint is profound weakness and fatigue tomorrow I talked to him tomorrow convinced that we are probably dealing with depression recently we did stress test which was perfectly normal.  Ejection fraction was preserved.  We stopped all different medication tried to see if any of the medication gave him profound weakness that he complained about none of this seems to be helping.  He complained of having back pain he complains also having constipation.  All this things have really making him very tired and exhausted.  Overall he does not feel well during the wintertime he gets up in the morning and he wished that he is death.  He does not have suicidal idealization.  He does not have homicidal idealization.  He was given some antidepressant medication however took it only for a few days and stop it I advised him to take this medication for couple more weeks to see if it works.  I will also try to find some psychotherapy/psychologist for him that we will be able to speak [place] with the language that he speaks.  Past Medical History:  Diagnosis Date  . Hyperlipidemia   . Hypertension     Past Surgical History:  Procedure Laterality Date  . SHOULDER SURGERY      Current Medications: Current Meds  Medication Sig  . aspirin EC 81 MG tablet Take 81 mg by mouth.  Marland Kitchen atorvastatin (LIPITOR) 20 MG tablet Take 1 tablet (20 mg total) by mouth daily.  . carvedilol (COREG)  3.125 MG tablet Take 2 tablets (6.25 mg total) by mouth 2 (two) times daily.  Marland Kitchen lisinopril (ZESTRIL) 2.5 MG tablet TAKE 1 TABLET(2.5 MG) BY MOUTH DAILY  . nitroGLYCERIN (NITROSTAT) 0.4 MG SL tablet Place 0.4 mg under the tongue.  . Vitamin D, Ergocalciferol, (DRISDOL) 50000 units CAPS capsule Take 1 capsule (50,000 Units total) by mouth every 7 (seven) days.     Allergies:   Ciprofloxacin, Enalapril, Metaxalone, Niacin, and Penicillins   Social History   Socioeconomic History  . Marital status: Married    Spouse name: Not on file  . Number of children: Not on file  . Years of education: Not on file  . Highest education level: Not on file  Occupational History  . Not on file  Social Needs  . Financial resource strain: Not on file  . Food insecurity    Worry: Not on file    Inability: Not on file  . Transportation needs    Medical: Not on file    Non-medical: Not on file  Tobacco Use  . Smoking status: Former Research scientist (life sciences)  . Smokeless tobacco: Never Used  Substance and Sexual Activity  . Alcohol use: Yes  . Drug use: No  . Sexual activity: Not on file  Lifestyle  . Physical activity    Days per week: Not on file    Minutes per session: Not on file  . Stress: Not  on file  Relationships  . Social Musician on phone: Not on file    Gets together: Not on file    Attends religious service: Not on file    Active member of club or organization: Not on file    Attends meetings of clubs or organizations: Not on file    Relationship status: Not on file  Other Topics Concern  . Not on file  Social History Narrative  . Not on file     Family History: The patient's family history includes Cancer in his father. ROS:   Please see the history of present illness.    All 14 point review of systems negative except as described per history of present illness  EKGs/Labs/Other Studies Reviewed:      Recent Labs: 05/05/2019: ALT 13; BUN 16; Creatinine, Ser 1.08; Potassium  4.6; Sodium 144; TSH 2.040  Recent Lipid Panel No results found for: CHOL, TRIG, HDL, CHOLHDL, VLDL, LDLCALC, LDLDIRECT  Physical Exam:    VS:  BP 134/66   Pulse 62   Ht 5\' 8"  (1.727 m)   Wt 173 lb 6.4 oz (78.7 kg)   SpO2 97%   BMI 26.37 kg/m     Wt Readings from Last 3 Encounters:  09/30/19 173 lb 6.4 oz (78.7 kg)  08/17/19 174 lb (78.9 kg)  08/12/19 174 lb 12.8 oz (79.3 kg)     GEN:  Well nourished, well developed in no acute distress HEENT: Normal NECK: No JVD; No carotid bruits LYMPHATICS: No lymphadenopathy CARDIAC: RRR, no murmurs, no rubs, no gallops RESPIRATORY:  Clear to auscultation without rales, wheezing or rhonchi  ABDOMEN: Soft, non-tender, non-distended MUSCULOSKELETAL:  No edema; No deformity  SKIN: Warm and dry LOWER EXTREMITIES: no swelling NEUROLOGIC:  Alert and oriented x 3 PSYCHIATRIC:  Normal affect   ASSESSMENT:    1. Coronary artery stenosis   2. Essential hypertension   3. Peripheral vascular disease (HCC)   4. Abdominal aortic aneurysm (AAA), 30-34 mm diameter (HCC)   5. Dyslipidemia    PLAN:    In order of problems listed above:  1. Coronary disease stable from that point review recent stress test negative 2. Essential hypertension blood pressure well controlled continue present management 3. Peripheral vascular disease: Stable 4. Abdominal trochanter rhythm small we will continue monitoring 5. Dyslipidemia we will continue present management.  I will try to find psychologist/psychiatrist psychotherapist for him that we will speak [place]   Medication Adjustments/Labs and Tests Ordered: Current medicines are reviewed at length with the patient today.  Concerns regarding medicines are outlined above.  No orders of the defined types were placed in this encounter.  Medication changes: No orders of the defined types were placed in this encounter.   Signed, 08/14/19, MD, [facility] 09/30/2019 2:02 PM    [facility]

## 2019-09-30 NOTE — Patient Instructions (Signed)
Medication Instructions:  Your physician recommends that you continue on your current medications as directed. Please refer to the Current Medication list given to you today.  *If you need a refill on your cardiac medications before your next appointment, please call your pharmacy*  Lab Work: None.  If you have labs (blood work) drawn today and your tests are completely normal, you will receive your results only by: Marland Kitchen MyChart Message (if you have MyChart) OR . A paper copy in the mail If you have any lab test that is abnormal or we need to change your treatment, we will call you to review the results.  Testing/Procedures: None.   Follow-Up: At [facility], you and your health needs are our priority.  As part of our continuing mission to provide you with exceptional heart care, we have created designated Provider Care Teams.  These Care Teams include your primary Cardiologist (physician) and Advanced Practice Providers (APPs -  Physician Assistants and Nurse Practitioners) who all work together to provide you with the care you need, when you need it.  Your next appointment:   2 month(s)  The format for your next appointment:   In Person  Provider:   You may see Dr. Agustin Cree  or the following Advanced Practice Provider on your designated Care Team:    Laurann Montana, FNP   Other Instructions

## 2019-11-18 ENCOUNTER — Ambulatory Visit: Payer: Medicare Other | Attending: Internal Medicine

## 2019-11-18 DIAGNOSIS — Z23 Encounter for immunization: Secondary | ICD-10-CM | POA: Insufficient documentation

## 2019-11-18 NOTE — Progress Notes (Signed)
   Covid-19 Vaccination Clinic  Name:  Stanley Cain    MRN: [id number] DOB: 08/19/1933  11/18/2019  Mr. [name] was observed post Covid-19 immunization for 15 minutes without incidence. He was provided with Vaccine Information Sheet and instruction to access the V-Safe system.   Mr. [name] was instructed to call 911 with any severe reactions post vaccine: Marland Kitchen Difficulty breathing  . Swelling of your face and throat  . A fast heartbeat  . A bad rash all over your body  . Dizziness and weakness    Immunizations Administered    Name Date Dose VIS Date Route   Pfizer COVID-19 Vaccine 11/18/2019  6:17 PM 0.3 mL 10/07/2019 Intramuscular   Manufacturer: [employer], [employer]   Lot: [id number]   NDC: [address]-9

## 2019-12-09 ENCOUNTER — Ambulatory Visit: Payer: Medicare Other | Attending: Internal Medicine

## 2019-12-09 DIAGNOSIS — Z23 Encounter for immunization: Secondary | ICD-10-CM

## 2019-12-09 NOTE — Progress Notes (Signed)
   Covid-19 Vaccination Clinic  Name:  Stanley Cain    MRN: [id number] DOB: 07-20-1933  12/09/2019  [name] was observed post Covid-19 immunization for 15 minutes without incidence. He was provided with Vaccine Information Sheet and instruction to access the V-Safe system.   [name] was instructed to call 911 with any severe reactions post vaccine: Marland Kitchen Difficulty breathing  . Swelling of your face and throat  . A fast heartbeat  . A bad rash all over your body  . Dizziness and weakness    Immunizations Administered    Name Date Dose VIS Date Route   Pfizer COVID-19 Vaccine 12/09/2019  5:28 PM 0.3 mL 10/07/2019 Intramuscular   Manufacturer: [employer], [employer]   Lot: [id number]   NDC: [address]-9

## 2019-12-15 ENCOUNTER — Telehealth: Payer: Self-pay | Admitting: *Deleted

## 2019-12-15 ENCOUNTER — Other Ambulatory Visit: Payer: Self-pay

## 2019-12-15 ENCOUNTER — Encounter: Payer: Self-pay | Admitting: Cardiology

## 2019-12-15 ENCOUNTER — Telehealth (INDEPENDENT_AMBULATORY_CARE_PROVIDER_SITE_OTHER): Payer: Medicare Other | Admitting: Cardiology

## 2019-12-15 VITALS — Ht 66.5 in | Wt 175.0 lb

## 2019-12-15 DIAGNOSIS — E785 Hyperlipidemia, unspecified: Secondary | ICD-10-CM

## 2019-12-15 DIAGNOSIS — I10 Essential (primary) hypertension: Secondary | ICD-10-CM

## 2019-12-15 DIAGNOSIS — I2510 Atherosclerotic heart disease of native coronary artery without angina pectoris: Secondary | ICD-10-CM

## 2019-12-15 DIAGNOSIS — I714 Abdominal aortic aneurysm, without rupture, unspecified: Secondary | ICD-10-CM

## 2019-12-15 DIAGNOSIS — I712 Thoracic aortic aneurysm, without rupture: Secondary | ICD-10-CM | POA: Diagnosis not present

## 2019-12-15 NOTE — Telephone Encounter (Signed)
..   Virtual Visit Pre-Appointment Phone Call  "(Name), I am calling you today to discuss your upcoming appointment. We are currently trying to limit exposure to the virus that causes COVID-19 by seeing patients at home rather than in the office."  1. "What is the BEST phone number to call the day of the visit?" - include this in appointment notes  2. "Do you have or have access to (through a family member/friend) a smartphone with video capability that we can use for your visit?" a. If yes - list this number in appt notes as "cell" (if different from BEST phone #) and list the appointment type as a VIDEO visit in appointment notes b. If no - list the appointment type as a PHONE visit in appointment notes  3. Confirm consent - "In the setting of the current Covid19 crisis, you are scheduled for a (phone or video) visit with your provider on (date) at (time).  Just as we do with many in-office visits, in order for you to participate in this visit, we must obtain consent.  If you'd like, I can send this to your mychart (if signed up) or email for you to review.  Otherwise, I can obtain your verbal consent now.  All virtual visits are billed to your insurance company just like a normal visit would be.  By agreeing to a virtual visit, we'd like you to understand that the technology does not allow for your provider to perform an examination, and thus may limit your provider's ability to fully assess your condition. If your provider identifies any concerns that need to be evaluated in person, we will make arrangements to do so.  Finally, though the technology is pretty good, we cannot assure that it will always work on either your or our end, and in the setting of a video visit, we may have to convert it to a phone-only visit.  In either situation, we cannot ensure that we have a secure connection.  Are you willing to proceed?" STAFF: Did the patient verbally acknowledge consent to telehealth visit? Document  YES/NO here: YES  4. Advise patient to be prepared - "Two hours prior to your appointment, go ahead and check your blood pressure, pulse, oxygen saturation, and your weight (if you have the equipment to check those) and write them all down. When your visit starts, your provider will ask you for this information. If you have an Apple Watch or Kardia device, please plan to have heart rate information ready on the day of your appointment. Please have a pen and paper handy nearby the day of the visit as well."  5. Give patient instructions for MyChart download to smartphone OR Doximity/Doxy.me as below if video visit (depending on what platform provider is using)  6. Inform patient they will receive a phone call 15 minutes prior to their appointment time (may be from unknown caller ID) so they should be prepared to answer    TELEPHONE CALL NOTE  Kushal Saunders has been deemed a candidate for a follow-up tele-health visit to limit community exposure during the Covid-19 pandemic. I spoke with the patient via phone to ensure availability of phone/video source, confirm preferred email & phone number, and discuss instructions and expectations.  I reminded Stanley Cain to be prepared with any vital sign and/or heart rhythm information that could potentially be obtained via home monitoring, at the time of his visit. I reminded Stanley Cain to expect a phone call prior to his visit.  Valrie Hart, CMA 12/15/2019 2:20 PM   INSTRUCTIONS FOR DOWNLOADING THE MYCHART APP TO SMARTPHONE  - The patient must first make sure to have activated MyChart and know their login information - If Apple, go to [employer] and type in MyChart in the search bar and download the app. If Android, ask patient to go to [employer] and type in [place] in the search bar and download the app. The app is free but as with any other app downloads, their phone may require them to verify saved payment information or  Apple/Android password.  - The patient will need to then log into the app with their MyChart username and password, and select [facility] as their healthcare provider to link the account. When it is time for your visit, go to the MyChart app, find appointments, and click Begin Video Visit. Be sure to Select Allow for your device to access the Microphone and Camera for your visit. You will then be connected, and your provider will be with you shortly.  **If they have any issues connecting, or need assistance please contact MyChart service desk [phone])**  **If using a computer, in order to ensure the best quality for their visit they will need to use either of the following Internet Browsers: [employer] or Google Chrome**  IF USING DOXIMITY or DOXY.ME - The patient will receive a link just prior to their visit by text.     FULL LENGTH CONSENT FOR TELE-HEALTH VISIT   I hereby voluntarily request, consent and authorize [facility] and its employed or contracted physicians, physician assistants, nurse practitioners or other licensed health care professionals (the Practitioner), to provide me with telemedicine health care services (the "Services") as deemed necessary by the treating Practitioner. I acknowledge and consent to receive the Services by the Practitioner via telemedicine. I understand that the telemedicine visit will involve communicating with the Practitioner through live audiovisual communication technology and the disclosure of certain medical information by electronic transmission. I acknowledge that I have been given the opportunity to request an in-person assessment or other available alternative prior to the telemedicine visit and am voluntarily participating in the telemedicine visit.  I understand that I have the right to withhold or withdraw my consent to the use of telemedicine in the course of my care at any time, without affecting my right to future care  or treatment, and that the Practitioner or I may terminate the telemedicine visit at any time. I understand that I have the right to inspect all information obtained and/or recorded in the course of the telemedicine visit and may receive copies of available information for a reasonable fee.  I understand that some of the potential risks of receiving the Services via telemedicine include:  Marland Kitchen Delay or interruption in medical evaluation due to technological equipment failure or disruption; . Information transmitted may not be sufficient (e.g. poor resolution of images) to allow for appropriate medical decision making by the Practitioner; and/or  . In rare instances, security protocols could fail, causing a breach of personal health information.  Furthermore, I acknowledge that it is my responsibility to provide information about my medical history, conditions and care that is complete and accurate to the best of my ability. I acknowledge that Practitioner's advice, recommendations, and/or decision may be based on factors not within their control, such as incomplete or inaccurate data provided by me or distortions of diagnostic images or specimens that may result from electronic transmissions. I understand that  the practice of medicine is not an exact science and that Practitioner makes no warranties or guarantees regarding treatment outcomes. I acknowledge that I will receive a copy of this consent concurrently upon execution via email to the email address I last provided but may also request a printed copy by calling the office of [place].    I understand that my insurance will be billed for this visit.   I have read or had this consent read to me. . I understand the contents of this consent, which adequately explains the benefits and risks of the Services being provided via telemedicine.  . I have been provided ample opportunity to ask questions regarding this consent and the Services and have had  my questions answered to my satisfaction. . I give my informed consent for the services to be provided through the use of telemedicine in my medical care  By participating in this telemedicine visit I agree to the above.

## 2019-12-15 NOTE — Patient Instructions (Signed)
Medication Instructions:  Your physician recommends that you continue on your current medications as directed. Please refer to the Current Medication list given to you today.  *If you need a refill on your cardiac medications before your next appointment, please call your pharmacy*  Lab Work: Your physician recommends that you return for lab work today: bmp   If you have labs (blood work) drawn today and your tests are completely normal, you will receive your results only by: Marland Kitchen MyChart Message (if you have MyChart) OR . A paper copy in the mail If you have any lab test that is abnormal or we need to change your treatment, we will call you to review the results.  Testing/Procedures: Non-Cardiac CT scanning, (CAT scanning), is a noninvasive, special x-ray that produces cross-sectional images of the body using x-rays and a computer. CT scans help physicians diagnose and treat medical conditions. For some CT exams, a contrast material is used to enhance visibility in the area of the body being studied. CT scans provide greater clarity and reveal more details than regular x-ray exams.    Follow-Up: At [facility], you and your health needs are our priority.  As part of our continuing mission to provide you with exceptional heart care, we have created designated Provider Care Teams.  These Care Teams include your primary Cardiologist (physician) and Advanced Practice Providers (APPs -  Physician Assistants and Nurse Practitioners) who all work together to provide you with the care you need, when you need it.  Your next appointment:   2 month(s)  The format for your next appointment:   In Person  Provider:   Gypsy Balsam, MD  Other Instructions

## 2019-12-15 NOTE — Progress Notes (Signed)
Virtual Visit via Telephone Note   This visit type was conducted due to national recommendations for restrictions regarding the COVID-19 Pandemic (e.g. social distancing) in an effort to limit this patient's exposure and mitigate transmission in our community.  Due to his co-morbid illnesses, this patient is at least at moderate risk for complications without adequate follow up.  This format is felt to be most appropriate for this patient at this time.  The patient did not have access to video technology/had technical difficulties with video requiring transitioning to audio format only (telephone).  All issues noted in this document were discussed and addressed.  No physical exam could be performed with this format.  Please refer to the patient's chart for his  consent to telehealth for [facility].  Evaluation Performed:  Follow-up visit  This visit type was conducted due to national recommendations for restrictions regarding the COVID-19 Pandemic (e.g. social distancing).  This format is felt to be most appropriate for this patient at this time.  All issues noted in this document were discussed and addressed.  No physical exam was performed (except for noted visual exam findings with Video Visits).  Please refer to the patient's chart (MyChart message for video visits and phone note for telephone visits) for the patient's consent to telehealth for [facility].  Date:  12/15/2019  ID: Stanley Cain, DOB Mar 18, 1933, MRN [id number]   Patient Location: [address] [place] [address]   Provider location:   [facility] [place] [facility]  PCP:  Andreas Blower., MD  Cardiologist:  Gypsy Balsam, MD     Chief Complaint: [name] and tired  History of Present Illness:    Stanley Cain is a 86 y.o. male  who presents via audio/video conferencing for a telehealth visit today.  Past medical history significant for coronary artery disease, peripheral vascular disease in form of  abdominal aortic aneurysm, last evaluation was done at the end of 2019 which showed distal portion of the mid aorta measuring 3.7 cm.  Also history of dyslipidemia.  He had been complaining of a lot of nonspecific symptoms of profound fatigue and tiredness in spite of that he still fairly active he goes for walk he does have stationary bike that he uses at home.  Recently I did quite extensive cardiac evaluation trying to figure out if any of his symptoms are related to his heart.  Stress test was done which was negative, echocardiogram was done which showed normal left ventricle ejection fraction in spite of that he still complain of having a lot of issues.  My thinking also was that she possibly does have a depression.  And I thought it would be beneficial for him to speak to some psychiatrist/psychologist in his native language which is polish, however the only probably psychiatrist that I know Dr. Loman Brooklyn works for [employer] system and she is not able to see patient outside of [place]. He also sees some pain specialist with limited success as well as recently got some injections to his spine also with limited success.   The patient does not have symptoms concerning for COVID-19 infection (fever, chills, cough, or new SHORTNESS OF BREATH).    Prior CV studies:   The following studies were reviewed today:  Stress test done on 17 August 2019 showed:  Nuclear stress EF: 71%.  There was no ST segment deviation noted during stress.  No T wave inversion was noted during stress.  The study is normal.  This is  a low risk study.    Echocardiogram done on the second of 14 December 2018 showed: 1. The left ventricle has normal systolic function with an ejection  fraction of 60-65%. The cavity size was normal. There is moderately  increased left ventricular wall thickness. Left ventricular diastolic  Doppler parameters are consistent with impaired  relaxation.  2. The right ventricle has normal systolic  function. The cavity was  normal. There is no increase in right ventricular wall thickness.  3. The mitral valve is normal in structure.  4. The tricuspid valve is normal in structure.  5. The aortic valve is tricuspid Aortic valve regurgitation is trivial by  color flow Doppler.  6. The pulmonic valve was normal in structure.  7. There is moderate dilatation of the ascending aorta measuring 39 mm.  8. Right atrial pressure is estimated at 3 mmHg.   Past Medical History:  Diagnosis Date  . Hyperlipidemia   . Hypertension     Past Surgical History:  Procedure Laterality Date  . SHOULDER SURGERY       Current Meds  Medication Sig  . acetaminophen (TYLENOL) 500 MG tablet Take 500 mg by mouth as needed.  Marland Kitchen aspirin EC 81 MG tablet Take 81 mg by mouth.  Marland Kitchen atorvastatin (LIPITOR) 20 MG tablet Take 1 tablet (20 mg total) by mouth daily.  . nitroGLYCERIN (NITROSTAT) 0.4 MG SL tablet Place 0.4 mg under the tongue.  . rosuvastatin (CRESTOR) 40 MG tablet Take 40 mg by mouth daily.  Marland Kitchen VITAMIN D PO Take 1 tablet by mouth daily.      Family History: The patient's family history includes Cancer in his father.   ROS:   Please see the history of present illness.     All other systems reviewed and are negative.   Labs/Other Tests and Data Reviewed:     Recent Labs: 05/05/2019: ALT 13; BUN 16; Creatinine, Ser 1.08; Potassium 4.6; Sodium 144; TSH 2.040  Recent Lipid Panel No results found for: CHOL, TRIG, HDL, CHOLHDL, VLDL, LDLCALC, LDLDIRECT    Exam:    Vital Signs:  Ht 5' 6.5" (1.689 m)   Wt 175 lb (79.4 kg)   BMI 27.82 kg/m     Wt Readings from Last 3 Encounters:  12/15/19 175 lb (79.4 kg)  09/30/19 173 lb 6.4 oz (78.7 kg)  08/17/19 174 lb (78.9 kg)     Well nourished, well developed in no acute distress. Alert awake currently on 3, unable to establish video link, therefore we took over the phone.  We speak [place].  He is frustrated with this entire situation.  I  still think portion of his symptomatology could be related to depression.  Denies have any cardiac complaints again he is able to go and walk outside with no major difficulties however, when he comes home he will feel profoundly weak and tired.  He have to sit in the chair and rest some.  On top of that anytime he eats something after that he is washed out and he has to rest for few minutes.  At the moment of my interview he is was fine.  Without any issues.  Diagnosis for this visit:   1. Coronary artery stenosis   2. Essential hypertension   3. Dyslipidemia   4. Abdominal aortic aneurysm (AAA), 30-34 mm diameter (HCC)      ASSESSMENT & PLAN:    1.  Coronary artery disease stable recent stress test reviewed with the patient which was negative.  2.  Essential hypertension blood pressure seems to be controlled continue present management. 3.  Dyslipidemia will call primary care physician to get his fasting lipid profile. 4.  Abdominal arctic aneurysm measuring 3.7 cm.  I will schedule him to have CT Angie of his abdomen to look up the abdominal aneurysm.  Overall I am concerned about him.  He does have a constellation of symptoms that are difficult to assess.  COVID-19 Education: The signs and symptoms of COVID-19 were discussed with the patient and how to seek care for testing (follow up with PCP or arrange E-visit).  The importance of social distancing was discussed today.  Patient Risk:   After full review of this patients clinical status, I feel that they are at least moderate risk at this time.  Time:   Today, I have spent 5 minutes with the patient with telehealth technology discussing pt health issues.  I spent 28 minutes reviewing her chart before the visit.  Visit was finished at 2:54 PM.    Medication Adjustments/Labs and Tests Ordered: Current medicines are reviewed at length with the patient today.  Concerns regarding medicines are outlined above.  No orders of the defined  types were placed in this encounter.  Medication changes: No orders of the defined types were placed in this encounter.    Disposition: Follow-up in 2 months  Signed, Park Liter, MD, [facility] 12/15/2019 2:48 PM    [place]

## 2019-12-23 ENCOUNTER — Other Ambulatory Visit: Payer: Self-pay

## 2019-12-23 ENCOUNTER — Ambulatory Visit (HOSPITAL_BASED_OUTPATIENT_CLINIC_OR_DEPARTMENT_OTHER)
Admission: RE | Admit: 2019-12-23 | Discharge: 2019-12-23 | Disposition: A | Discharge status: Home / Self Care | Payer: Medicare Other | Source: Ambulatory Visit | Attending: Cardiology | Admitting: Cardiology

## 2019-12-23 DIAGNOSIS — I714 Abdominal aortic aneurysm, without rupture: Secondary | ICD-10-CM | POA: Insufficient documentation

## 2019-12-23 LAB — BASIC METABOLIC PANEL
BUN/Creatinine Ratio: 17 (ref 10–24)
BUN: 18 mg/dL (ref 8–27)
CO2: 25 mmol/L (ref 20–29)
Calcium: 9.6 mg/dL (ref 8.6–10.2)
Chloride: 105 mmol/L (ref 96–106)
Creatinine, Ser: 1.08 mg/dL (ref 0.76–1.27)
GFR calc Af Amer: 72 mL/min/{1.73_m2} (ref >59)
GFR calc non Af Amer: 62 mL/min/{1.73_m2} (ref >59)
Glucose: 101 mg/dL — ABNORMAL HIGH (ref 65–99)
Potassium: 5 mmol/L (ref 3.5–5.2)
Sodium: 142 mmol/L (ref 134–144)

## 2019-12-23 MED ORDER — IOHEXOL 350 MG/ML SOLN
100.0000 mL | Freq: Once | INTRAVENOUS | Status: AC | PRN
  Administered 2019-12-23: 100 mL via INTRAVENOUS

## 2020-01-07 IMAGING — US US AORTA
2 series · 14 of 25 positions shown · non-contrast
Comparison: 05/29/2015

CLINICAL DATA: Follow-up abdominal aortic aneurysm.

EXAM:
ULTRASOUND OF ABDOMINAL AORTA
TECHNIQUE: Ultrasound examination of the abdominal aorta was performed to
evaluate for abdominal aortic aneurysm.

[Series 1: us aorta · 0.27mm/px · 13 of 37 slices shown (1 of 2)]
[im 1/37]
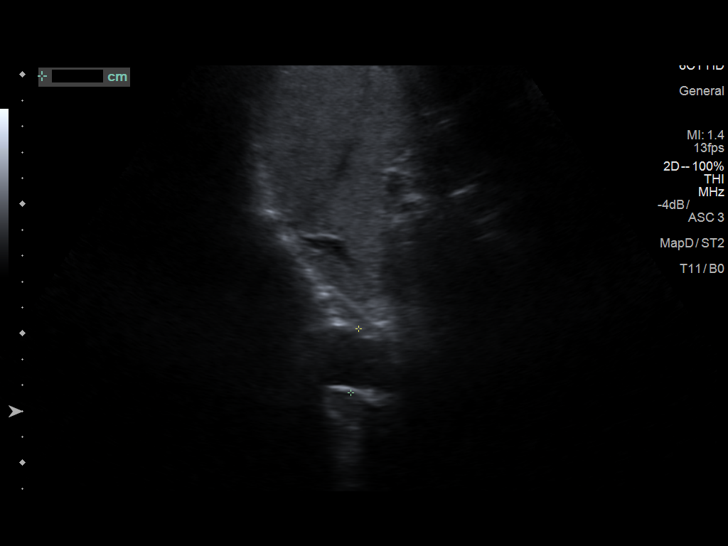
[im 4/37]
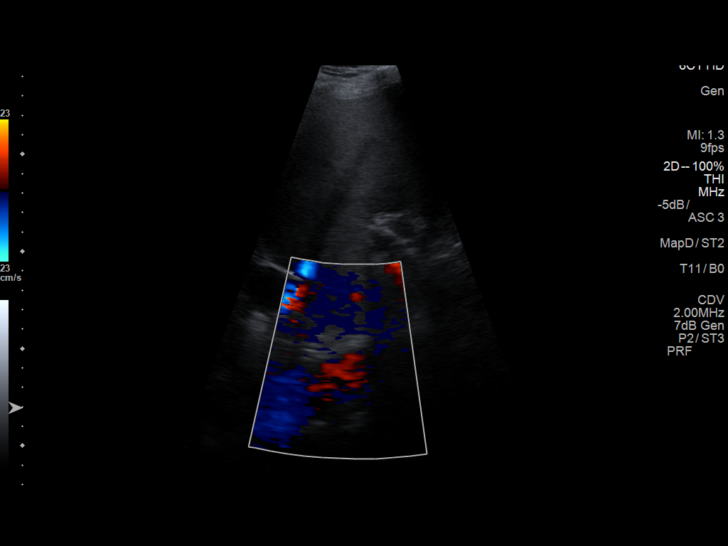
[im 7/37]
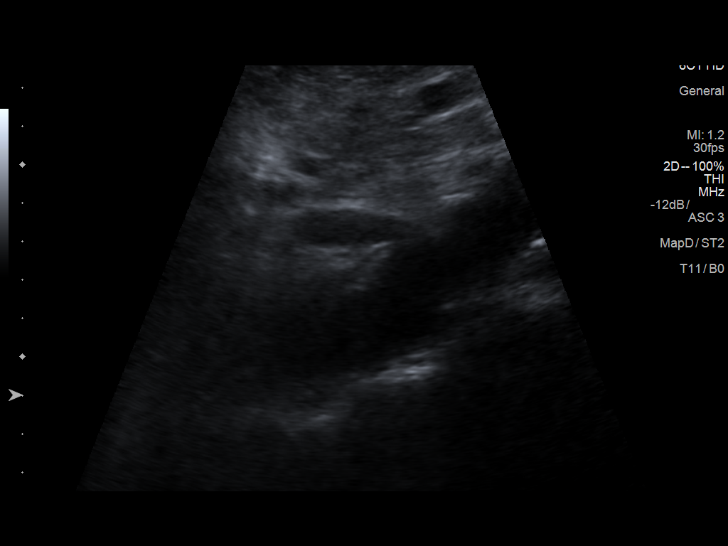
[im 10/37]
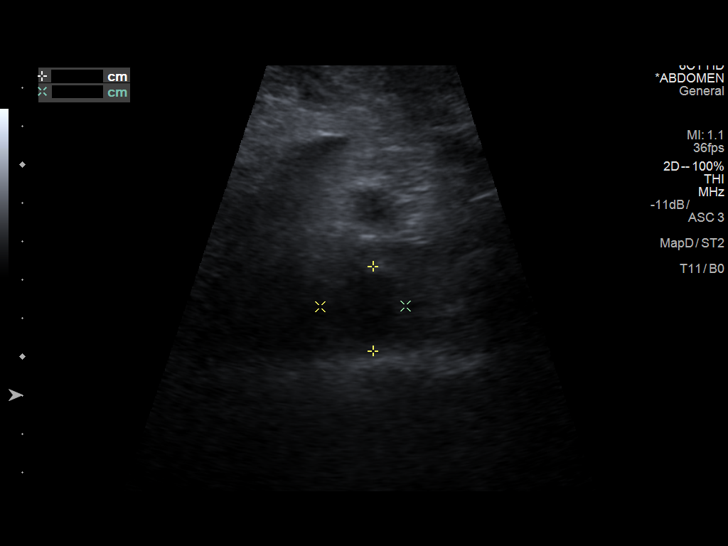
[im 13/37]
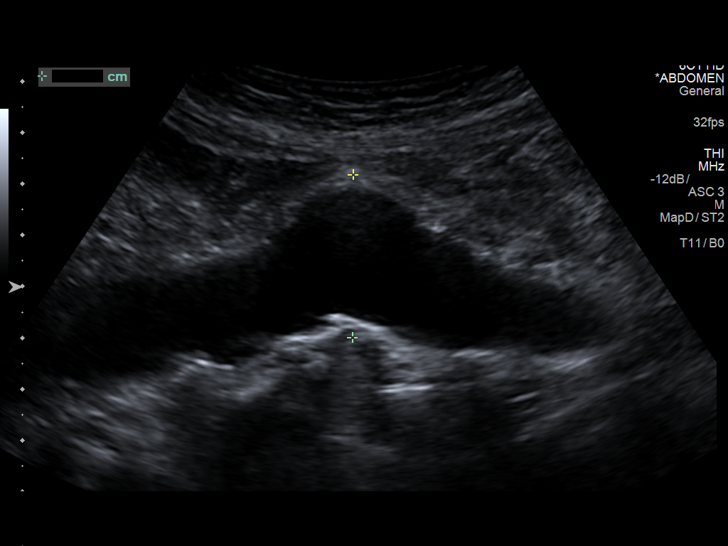
[im 15/37]
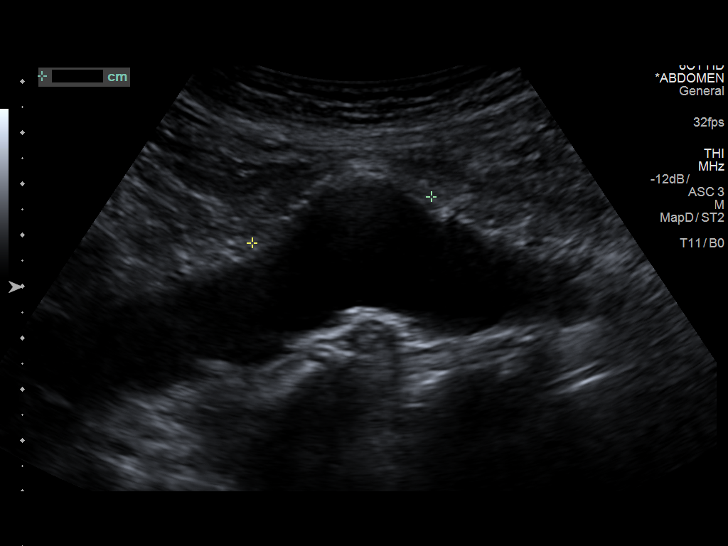
[im 18/37]
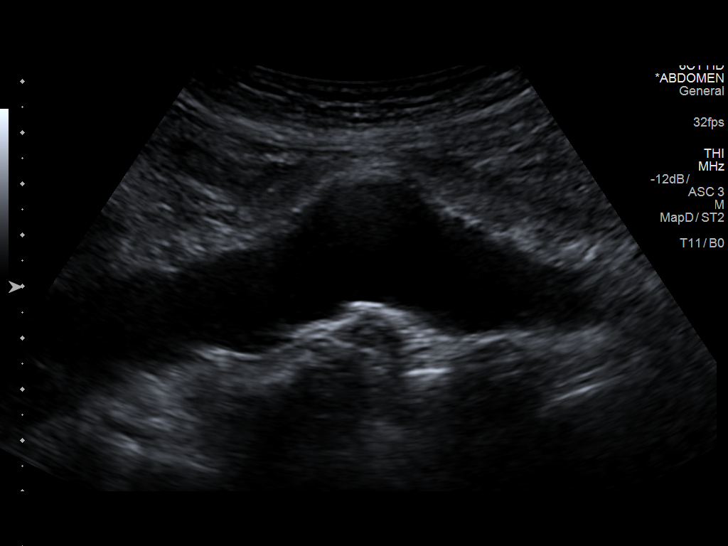
[im 21/37]
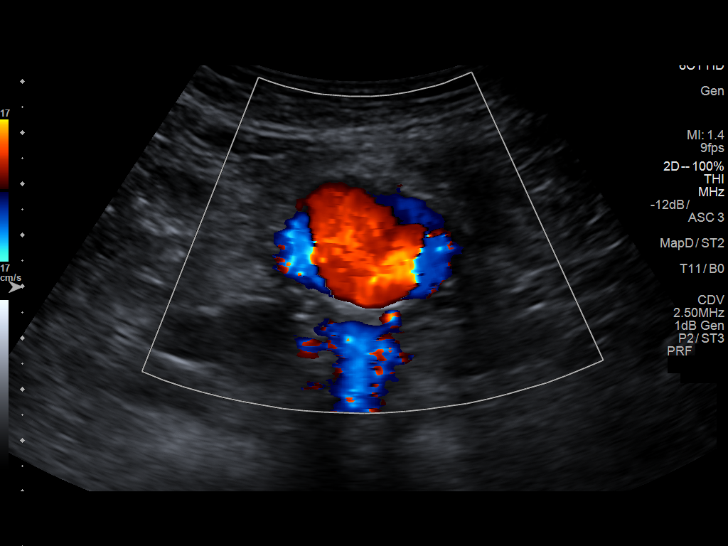
[im 24/37]
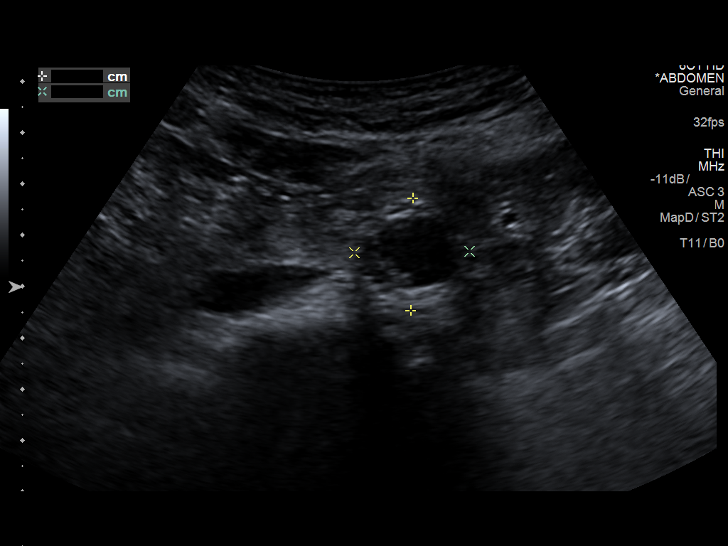
[im 26/37]
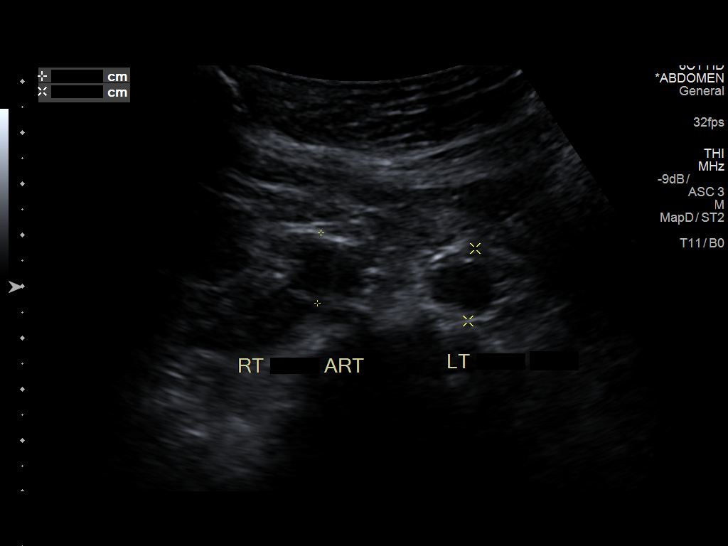
[im 29/37]
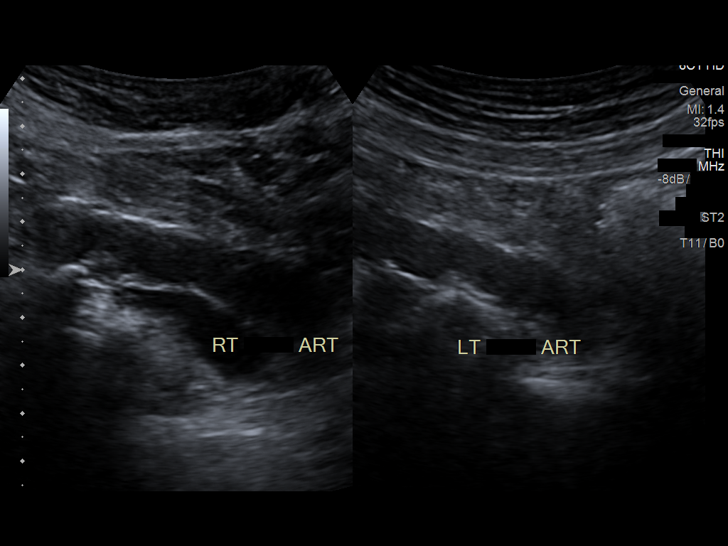
[im 32/37]
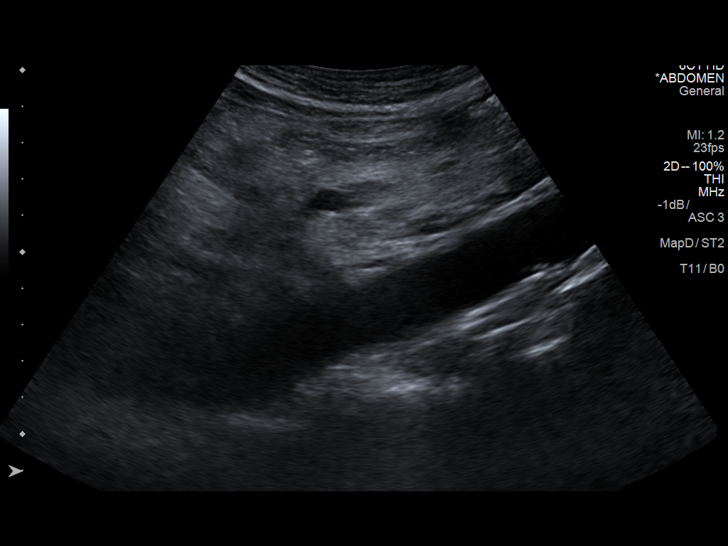
[im 35/37]
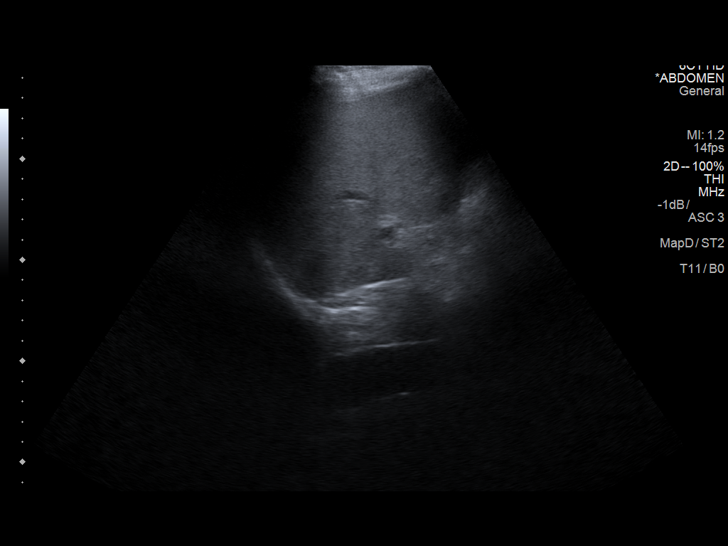

[Series 2: us aorta · 0.25mm/px · 1 of 2 slices shown (2 of 2)]
[im 1/2]
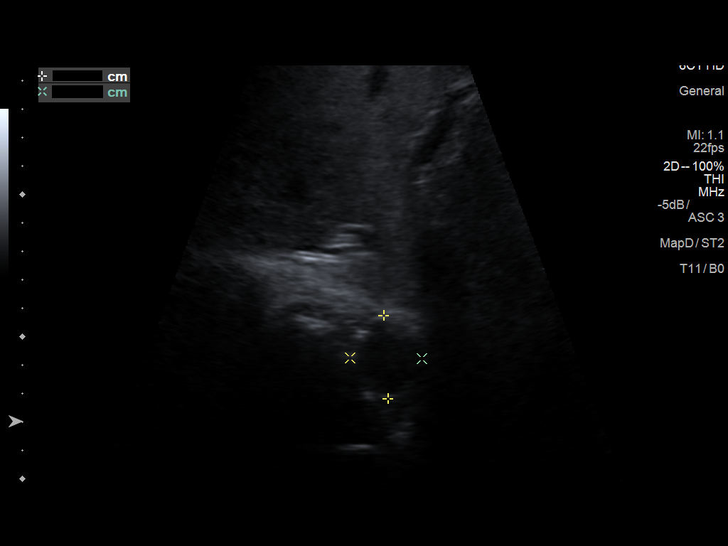

[14 of 25 positions shown; findings below may reference images not displayed]

FINDINGS: Abdominal aortic measurements as follows:

Proximal:  2.9 cm

Mid:  2.4 cm

Distal:  3.7 cm
IMPRESSION: Maximal diameter of the abdominal aorta is 3.7 cm in the distal
aorta compared with 2.8 cm on the prior study. Recommend followup by
ultrasound in 3 years. This recommendation follows ACR consensus
guidelines: White Paper of the ACR Incidental Findings Committee II

## 2020-02-14 ENCOUNTER — Other Ambulatory Visit: Payer: Self-pay

## 2020-02-14 ENCOUNTER — Ambulatory Visit: Payer: Medicare Other | Admitting: Cardiology

## 2020-02-14 ENCOUNTER — Encounter: Payer: Self-pay | Admitting: Cardiology

## 2020-02-14 VITALS — BP 148/70 | HR 80 | Temp 98.2°F | Ht 68.0 in | Wt 175.8 lb

## 2020-02-14 DIAGNOSIS — I714 Abdominal aortic aneurysm, without rupture, unspecified: Secondary | ICD-10-CM

## 2020-02-14 DIAGNOSIS — I10 Essential (primary) hypertension: Secondary | ICD-10-CM | POA: Diagnosis not present

## 2020-02-14 DIAGNOSIS — I2510 Atherosclerotic heart disease of native coronary artery without angina pectoris: Secondary | ICD-10-CM | POA: Diagnosis not present

## 2020-02-14 DIAGNOSIS — I739 Peripheral vascular disease, unspecified: Secondary | ICD-10-CM | POA: Diagnosis not present

## 2020-02-14 DIAGNOSIS — E785 Hyperlipidemia, unspecified: Secondary | ICD-10-CM

## 2020-02-14 NOTE — Patient Instructions (Addendum)
Medication Instructions:  Your physician recommends that you continue on your current medications as directed. Please refer to the Current Medication list given to you today.  *If you need a refill on your cardiac medications before your next appointment, please call your pharmacy*   Lab Work: None ordered  If you have labs (blood work) drawn today and your tests are completely normal, you will receive your results only by: . MyChart Message (if you have MyChart) OR . A paper copy in the mail If you have any lab test that is abnormal or we need to change your treatment, we will call you to review the results.   Testing/Procedures: None ordered   Follow-Up: At [facility], you and your health needs are our priority.  As part of our continuing mission to provide you with exceptional heart care, we have created designated Provider Care Teams.  These Care Teams include your primary Cardiologist (physician) and Advanced Practice Providers (APPs -  Physician Assistants and Nurse Practitioners) who all work together to provide you with the care you need, when you need it.  We recommend signing up for the patient portal called "MyChart".  Sign up information is provided on this After Visit Summary.  MyChart is used to connect with patients for Virtual Visits (Telemedicine).  Patients are able to view lab/test results, encounter notes, upcoming appointments, etc.  Non-urgent messages can be sent to your provider as well.   To learn more about what you can do with MyChart, go to [email or web address].    Your next appointment:   4 month(s)  The format for your next appointment:   In Person  Provider:   Robert Krasowski, MD   Other Instructions   

## 2020-02-14 NOTE — Progress Notes (Signed)
Cardiology Office Note:    Date:  02/14/2020   ID:  Stanley Cain, DOB 1933-02-03, MRN [id number]  PCP:  Andreas Blower., MD  Cardiologist:  Gypsy Balsam, MD    Referring MD: Andreas Blower., MD   No chief complaint on file. Still not feeling well  History of Present Illness:    Stanley Cain is a 86 y.o. male Past medical history significant for coronary artery disease, peripheral vascular disease in form of abdominal aortic aneurysm, last evaluation was done at the end of 2019 which showed distal portion of the mid aorta measuring 3.7 cm.  Also history of dyslipidemia.  He had been complaining of a lot of nonspecific symptoms of profound fatigue and tiredness in spite of that he still fairly active he goes for walk he does have stationary bike that he uses at home.  Recently I did quite extensive cardiac evaluation trying to figure out if any of his symptoms are related to his heart.  Stress test was done which was negative, echocardiogram was done which showed normal left ventricle ejection fraction in spite of that he still complain of having a lot of issues.  My thinking also was that she possibly does have a depression. Again today's visit is very difficult he offers a lot of complaint weak tired exhausted I did discover that when the morning when he does he gets up he drink some water and then he goes for walk after that he comes home and he is horribly exhausted.  Asking to change to schedule and I want him to have some breakfast first and then wait for about half an hour 45 minutes and then go for a walk.  She also complained of having some back pain also not feeling well episode of weakness and profound fatigue.  So far work-up has been unrevealing.  Past Medical History:  Diagnosis Date  . Hyperlipidemia   . Hypertension     Past Surgical History:  Procedure Laterality Date  . SHOULDER SURGERY      Current Medications: Current Meds  Medication Sig  . acetaminophen  (TYLENOL) 500 MG tablet Take 500 mg by mouth as needed.  Marland Kitchen aspirin EC 81 MG tablet Take 81 mg by mouth.  Marland Kitchen atorvastatin (LIPITOR) 20 MG tablet Take 1 tablet (20 mg total) by mouth daily.  . nitroGLYCERIN (NITROSTAT) 0.4 MG SL tablet Place 0.4 mg under the tongue.  . pregabalin (LYRICA) 50 MG capsule Take 50 mg by mouth 2 (two) times daily.  . rosuvastatin (CRESTOR) 40 MG tablet Take 40 mg by mouth daily.  Marland Kitchen VITAMIN D PO Take 1 tablet by mouth daily.     Allergies:   Ciprofloxacin, Duloxetine, Enalapril, Metaxalone, Niacin, and Penicillins   Social History   Socioeconomic History  . Marital status: Married    Spouse name: Not on file  . Number of children: Not on file  . Years of education: Not on file  . Highest education level: Not on file  Occupational History  . Not on file  Tobacco Use  . Smoking status: Former Games developer  . Smokeless tobacco: Never Used  Substance and Sexual Activity  . Alcohol use: Yes  . Drug use: No  . Sexual activity: Not on file  Other Topics Concern  . Not on file  Social History Narrative  . Not on file   Social Determinants of Health   Financial Resource Strain:   . Difficulty of Paying Living Expenses:   Food Insecurity:   .  Worried About Programme researcher, broadcasting/film/video in the Last Year:   . Barista in the Last Year:   Transportation Needs:   . Freight forwarder (Medical):   Marland Kitchen Lack of Transportation (Non-Medical):   Physical Activity:   . Days of Exercise per Week:   . Minutes of Exercise per Session:   Stress:   . Feeling of Stress :   Social Connections:   . Frequency of Communication with Friends and Family:   . Frequency of Social Gatherings with Friends and Family:   . Attends Religious Services:   . Active Member of Clubs or Organizations:   . Attends Banker Meetings:   Marland Kitchen Marital Status:      Family History: The patient's family history includes Cancer in his father. ROS:   Please see the history of present  illness.    All 14 point review of systems negative except as described per history of present illness  EKGs/Labs/Other Studies Reviewed:      Recent Labs: 05/05/2019: ALT 13; TSH 2.040 12/22/2019: BUN 18; Creatinine, Ser 1.08; Potassium 5.0; Sodium 142  Recent Lipid Panel No results found for: CHOL, TRIG, HDL, CHOLHDL, VLDL, LDLCALC, LDLDIRECT  Physical Exam:    VS:  BP (!) 148/70   Pulse 80   Temp 98.2 F (36.8 C)   Ht 5\' 8"  (1.727 m)   Wt 175 lb 12.8 oz (79.7 kg)   SpO2 97%   BMI 26.73 kg/m     Wt Readings from Last 3 Encounters:  02/14/20 175 lb 12.8 oz (79.7 kg)  12/15/19 175 lb (79.4 kg)  09/30/19 173 lb 6.4 oz (78.7 kg)     GEN:  Well nourished, well developed in no acute distress HEENT: Normal NECK: No JVD; No carotid bruits LYMPHATICS: No lymphadenopathy CARDIAC: RRR, no murmurs, no rubs, no gallops RESPIRATORY:  Clear to auscultation without rales, wheezing or rhonchi  ABDOMEN: Soft, non-tender, non-distended MUSCULOSKELETAL:  No edema; No deformity  SKIN: Warm and dry LOWER EXTREMITIES: no swelling NEUROLOGIC:  Alert and oriented x 3 PSYCHIATRIC:  Normal affect   ASSESSMENT:    1. Abdominal aortic aneurysm (AAA), 30-34 mm diameter (HCC)   2. Coronary artery stenosis   3. Essential hypertension   4. Peripheral vascular disease (HCC)   5. Dyslipidemia    PLAN:    In order of problems listed above:  1. Abdominal aneurysm last CT reviewed.  Same diameter no significant enlargement.  Continue conservative approach. 2. Coronary artery disease stress test done recently in October showed no significant coronary artery stenosis. 3. Essential hypertension blood pressure seems to be controlled continue present management. 4. Peripheral vascular disease without critical stenosis. 5. Dyslipidemia have some problem with taking medications.  Overall he is getting frustrated.  He is not feeling well and nobody can figure out exactly what the problem is.  As I  stated before I suspect significant problem is depression.  I initiated talking to him about this he is not receptive for that.  He does not think it is a problem.  I do have his wife in my office next Friday and I will talk to her about this as well.  He gave me permission to talk about this.   Medication Adjustments/Labs and Tests Ordered: Current medicines are reviewed at length with the patient today.  Concerns regarding medicines are outlined above.  No orders of the defined types were placed in this encounter.  Medication changes: No orders of  the defined types were placed in this encounter.   Signed, Park Liter, MD, [facility] 02/14/2020 11:03 AM    [place]

## 2020-02-17 NOTE — Addendum Note (Signed)
Addended by: Theola Sequin on: 02/17/2020 02:21 PM   Modules accepted: Orders

## 2020-03-01 ENCOUNTER — Other Ambulatory Visit: Payer: Self-pay | Admitting: Cardiology

## 2020-05-03 DIAGNOSIS — R413 Other amnesia: Secondary | ICD-10-CM

## 2020-05-03 HISTORY — DX: Other amnesia: R41.3

## 2020-06-20 ENCOUNTER — Other Ambulatory Visit: Payer: Self-pay

## 2020-06-20 ENCOUNTER — Ambulatory Visit: Payer: Medicare Other | Admitting: Cardiology

## 2020-06-20 ENCOUNTER — Encounter: Payer: Self-pay | Admitting: Cardiology

## 2020-06-20 VITALS — BP 142/64 | HR 58 | Ht 68.0 in | Wt 177.0 lb

## 2020-06-20 DIAGNOSIS — K219 Gastro-esophageal reflux disease without esophagitis: Secondary | ICD-10-CM

## 2020-06-20 DIAGNOSIS — I10 Essential (primary) hypertension: Secondary | ICD-10-CM | POA: Diagnosis not present

## 2020-06-20 DIAGNOSIS — I739 Peripheral vascular disease, unspecified: Secondary | ICD-10-CM

## 2020-06-20 DIAGNOSIS — I2510 Atherosclerotic heart disease of native coronary artery without angina pectoris: Secondary | ICD-10-CM

## 2020-06-20 DIAGNOSIS — I714 Abdominal aortic aneurysm, without rupture, unspecified: Secondary | ICD-10-CM

## 2020-06-20 DIAGNOSIS — E785 Hyperlipidemia, unspecified: Secondary | ICD-10-CM

## 2020-06-20 NOTE — Patient Instructions (Signed)

## 2020-06-20 NOTE — Progress Notes (Signed)
Cardiology Office Note:    Date:  06/20/2020   ID:  Flavio Lindroth, DOB 12/09/1932, MRN [id number]  PCP:  Andreas Blower., MD  Cardiologist:  Gypsy Balsam, MD    Referring MD: Andreas Blower., MD   No chief complaint on file. Doing better  History of Present Illness:    Stanley Cain is a 86 y.o. male with past medical history significant for coronary artery disease, peripheral vascular disease in form of abdominal over-the-counter visit measuring 3.7 cm, dyslipidemia.  He comes today to my office for follow-up overall he seems to be a little more optimistic today.  Overall he seems to be doing a little better.  He still trying to exercise on the regular basis.  He get tired easily he also get episode of profound weakness and fatigue but this is a problem that he has been experiencing for a long time and so far work-up has been unrevealing.  Still leading complaint he said is constipation.  Past Medical History:  Diagnosis Date  . Hyperlipidemia   . Hypertension     Past Surgical History:  Procedure Laterality Date  . SHOULDER SURGERY      Current Medications: Current Meds  Medication Sig  . acetaminophen (TYLENOL) 500 MG tablet Take 500 mg by mouth as needed.  Marland Kitchen aspirin EC 81 MG tablet Take 81 mg by mouth.  . clopidogrel (PLAVIX) 75 MG tablet Take 75 mg by mouth daily.  Marland Kitchen lisinopril (ZESTRIL) 2.5 MG tablet TAKE 1 TABLET(2.5 MG) BY MOUTH DAILY  . nitroGLYCERIN (NITROSTAT) 0.4 MG SL tablet Place 0.4 mg under the tongue.  . pantoprazole (PROTONIX) 40 MG tablet Take 40 mg by mouth daily.  . pregabalin (LYRICA) 50 MG capsule Take 50 mg by mouth 2 (two) times daily.  . rosuvastatin (CRESTOR) 40 MG tablet Take 40 mg by mouth daily.  Marland Kitchen VITAMIN D PO Take 1 tablet by mouth daily.     Allergies:   Ciprofloxacin, Duloxetine, Enalapril, Metaxalone, Niacin, and Penicillins   Social History   Socioeconomic History  . Marital status: Married    Spouse name: Not on file  .  Number of children: Not on file  . Years of education: Not on file  . Highest education level: Not on file  Occupational History  . Not on file  Tobacco Use  . Smoking status: Former Games developer  . Smokeless tobacco: Never Used  Vaping Use  . Vaping Use: Never used  Substance and Sexual Activity  . Alcohol use: Yes  . Drug use: No  . Sexual activity: Not on file  Other Topics Concern  . Not on file  Social History Narrative  . Not on file   Social Determinants of Health   Financial Resource Strain:   . Difficulty of Paying Living Expenses: Not on file  Food Insecurity:   . Worried About Programme researcher, broadcasting/film/video in the Last Year: Not on file  . Ran Out of Food in the Last Year: Not on file  Transportation Needs:   . Lack of Transportation (Medical): Not on file  . Lack of Transportation (Non-Medical): Not on file  Physical Activity:   . Days of Exercise per Week: Not on file  . Minutes of Exercise per Session: Not on file  Stress:   . Feeling of Stress : Not on file  Social Connections:   . Frequency of Communication with Friends and Family: Not on file  . Frequency of Social Gatherings with Friends and Family:  Not on file  . Attends Religious Services: Not on file  . Active Member of Clubs or Organizations: Not on file  . Attends Banker Meetings: Not on file  . Marital Status: Not on file     Family History: The patient's family history includes Cancer in his father. ROS:   Please see the history of present illness.    All 14 point review of systems negative except as described per history of present illness  EKGs/Labs/Other Studies Reviewed:      Recent Labs: 12/22/2019: BUN 18; Creatinine, Ser 1.08; Potassium 5.0; Sodium 142  Recent Lipid Panel No results found for: CHOL, TRIG, HDL, CHOLHDL, VLDL, LDLCALC, LDLDIRECT  Physical Exam:    VS:  BP (!) 142/64   Pulse (!) 58   Ht 5\' 8"  (1.727 m)   Wt 177 lb (80.3 kg)   SpO2 95%   BMI 26.91 kg/m     Wt  Readings from Last 3 Encounters:  06/20/20 177 lb (80.3 kg)  02/14/20 175 lb 12.8 oz (79.7 kg)  12/15/19 175 lb (79.4 kg)     GEN:  Well nourished, well developed in no acute distress HEENT: Normal NECK: No JVD; No carotid bruits LYMPHATICS: No lymphadenopathy CARDIAC: RRR, no murmurs, no rubs, no gallops RESPIRATORY:  Clear to auscultation without rales, wheezing or rhonchi  ABDOMEN: Soft, non-tender, non-distended MUSCULOSKELETAL:  No edema; No deformity  SKIN: Warm and dry LOWER EXTREMITIES: no swelling NEUROLOGIC:  Alert and oriented x 3 PSYCHIATRIC:  Normal affect   ASSESSMENT:    1. Coronary artery stenosis   2. Peripheral vascular disease (HCC)   3. Essential hypertension   4. Abdominal aortic aneurysm (AAA), 30-34 mm diameter (HCC)   5. Gastroesophageal reflux disease without esophagitis   6. Dyslipidemia    PLAN:    In order of problems listed above:  1. Coronary disease: Stable from that point review on appropriate medications. 2. Peripheral vascular disease.  Doing fine asymptomatic 3. I see him we will look at his aorta. 4. Essential hypertension blood pressure relatively well controlled but I am worried about is the fact that he may have episode of hypotension I asked him to check blood pressure when he feels very lousy and he does have some episode and he feels very weak tired and he cannot do anything.  Again blood pressure will be checked at that time. 5. History of the abdominal arctic aneurysm.  We will recheck his aneurysm 6. When I see him. 7. Dyslipidemia.  He is taking statin will look for results of his fasting lipid profile from his primary care physician.   Medication Adjustments/Labs and Tests Ordered: Current medicines are reviewed at length with the patient today.  Concerns regarding medicines are outlined above.  No orders of the defined types were placed in this encounter.  Medication changes: No orders of the defined types were placed in  this encounter.   Signed, 12/17/19, MD, [facility] 06/20/2020 11:16 AM    [facility]

## 2020-07-17 ENCOUNTER — Other Ambulatory Visit: Payer: Self-pay | Admitting: Cardiology

## 2020-12-31 DIAGNOSIS — I10 Essential (primary) hypertension: Secondary | ICD-10-CM | POA: Insufficient documentation

## 2020-12-31 DIAGNOSIS — E785 Hyperlipidemia, unspecified: Secondary | ICD-10-CM | POA: Insufficient documentation

## 2021-01-03 ENCOUNTER — Encounter: Payer: Self-pay | Admitting: Cardiology

## 2021-01-03 ENCOUNTER — Other Ambulatory Visit: Payer: Self-pay

## 2021-01-03 ENCOUNTER — Ambulatory Visit: Payer: Medicare Other | Admitting: Cardiology

## 2021-01-03 VITALS — BP 126/80 | HR 84 | Ht 68.0 in | Wt 173.0 lb

## 2021-01-03 DIAGNOSIS — I10 Essential (primary) hypertension: Secondary | ICD-10-CM | POA: Diagnosis not present

## 2021-01-03 DIAGNOSIS — I714 Abdominal aortic aneurysm, without rupture, unspecified: Secondary | ICD-10-CM

## 2021-01-03 DIAGNOSIS — E785 Hyperlipidemia, unspecified: Secondary | ICD-10-CM | POA: Diagnosis not present

## 2021-01-03 MED ORDER — LISINOPRIL 2.5 MG PO TABS: ORAL_TABLET | ORAL | 4 refills | Status: AC

## 2021-01-03 NOTE — Progress Notes (Signed)
Cardiology Office Note:    Date:  01/03/2021   ID:  Stanley Cain, DOB 09/09/1933, MRN [id number]  PCP:  Andreas Blower., MD  Cardiologist:  Gypsy Balsam, MD    Referring MD: Andreas Blower., MD   Chief Complaint  Patient presents with  . Follow-up  . CT results    History of Present Illness:    Stanley Cain is a 87 y.o. male with past medical history significant for coronary artery disease status post PTCA and stenting long time ago, peripheral vascular disease in form of abdominal aortic aneurysm measuring 3.7 cm, also essential hypertension.  Dyslipidemia.  He comes today to my office because he got numerous complaints those complaints typically revolve around to being weak tired exhausted.  He said he gets up in the morning goes for walk on back eats breakfast and after that he is completely washed out he have to rest in the chair for sometimes hours.  He did have quite extensive evaluation done both by GI as well as neurology none of this seems to be revealing.  Cardiac wise denies have any chest pain tightness squeezing pressure burning chest no swelling of lower extremities no palpitations dizziness.  Past Medical History:  Diagnosis Date  . Abdominal aortic aneurysm (AAA), 30-34 mm diameter (HCC) 05/17/2015  . Chronic constipation 11/01/2015  . Coronary artery stenosis 05/16/2015  . Cortical age-related cataract of both eyes 04/15/2018  . Diarrhea 07/27/2018  . Dyslipidemia 05/17/2015  . Essential hypertension 05/17/2015  . GAD (generalized anxiety disorder) 09/14/2019  . Gastroesophageal reflux disease without esophagitis 10/25/2016  . Glenohumeral arthritis 07/07/2014  . History of colon polyps 11/01/2015  . Hyperlipidemia   . Hypertension   . 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 Memory loss 05/03/2020  . Other spondylosis with radiculopathy, lumbar region 05/10/2018  . Peripheral vascular disease (HCC) 05/17/2015  . Sacroiliac joint pain 05/10/2018  . Senile nuclear sclerosis  04/15/2018   Formatting of this note might be different from the original. Added automatically from request for surgery [phone]  . Sepsis (HCC) 10/25/2016  . Spinal stenosis of lumbar region with neurogenic claudication 02/08/2018  . Vitamin B12 deficiency 05/13/2019  . Weakness 07/21/2017    Past Surgical History:  Procedure Laterality Date  . SHOULDER SURGERY      Current Medications: Current Meds  Medication Sig  . acetaminophen (TYLENOL) 500 MG tablet Take 500 mg by mouth as needed for moderate pain.  . carvedilol (COREG) 3.125 MG tablet Take 1 tablet by mouth daily. Patient decrease from 1 bid to 1 qd  . clopidogrel (PLAVIX) 75 MG tablet Take 75 mg by mouth daily.  . pantoprazole (PROTONIX) 40 MG tablet Take 40 mg by mouth daily.  . pregabalin (LYRICA) 50 MG capsule Take 50 mg by mouth 2 (two) times daily.  . rosuvastatin (CRESTOR) 40 MG tablet Take 40 mg by mouth daily.  Marland Kitchen VITAMIN D PO Take 1 tablet by mouth daily.  . [DISCONTINUED] lisinopril (ZESTRIL) 2.5 MG tablet TAKE 1 TABLET(2.5 MG) BY MOUTH DAILY  . [DISCONTINUED] nitroGLYCERIN (NITROSTAT) 0.4 MG SL tablet Place 0.4 mg under the tongue.     Allergies:   Ciprofloxacin, Duloxetine, Enalapril, Metaxalone, Niacin, and Penicillins   Social History   Socioeconomic History  . Marital status: Married    Spouse name: Not on file  . Number of children: Not on file  . Years of education: Not on file  . Highest education level: Not on file  Occupational History  .  Not on file  Tobacco Use  . Smoking status: Former Games developer  . Smokeless tobacco: Never Used  Vaping Use  . Vaping Use: Never used  Substance and Sexual Activity  . Alcohol use: Yes  . Drug use: No  . Sexual activity: Not on file  Other Topics Concern  . Not on file  Social History Narrative  . Not on file   Social Determinants of Health   Financial Resource Strain: Not on file  Food Insecurity: Not on file  Transportation Needs: Not on file  Physical  Activity: Not on file  Stress: Not on file  Social Connections: Not on file     Family History: The patient's family history includes Cancer in his father. ROS:   Please see the history of present illness.    All 14 point review of systems negative except as described per history of present illness  EKGs/Labs/Other Studies Reviewed:      Recent Labs: No results found for requested labs within last 8760 hours.  Recent Lipid Panel No results found for: CHOL, TRIG, HDL, CHOLHDL, VLDL, LDLCALC, LDLDIRECT  Physical Exam:    VS:  BP 126/80 (BP Location: Right Arm, Patient Position: Sitting)   Pulse 84   Ht 5\' 8"  (1.727 m)   Wt 173 lb (78.5 kg)   SpO2 95%   BMI 26.30 kg/m     Wt Readings from Last 3 Encounters:  01/03/21 173 lb (78.5 kg)  06/20/20 177 lb (80.3 kg)  02/14/20 175 lb 12.8 oz (79.7 kg)     GEN:  Well nourished, well developed in no acute distress HEENT: Normal NECK: No JVD; No carotid bruits LYMPHATICS: No lymphadenopathy CARDIAC: RRR, no murmurs, no rubs, no gallops RESPIRATORY:  Clear to auscultation without rales, wheezing or rhonchi  ABDOMEN: Soft, non-tender, non-distended MUSCULOSKELETAL:  No edema; No deformity  SKIN: Warm and dry LOWER EXTREMITIES: no swelling NEUROLOGIC:  Alert and oriented x 3 PSYCHIATRIC:  Normal affect   ASSESSMENT:    1. Primary hypertension   2. Essential hypertension   3. Abdominal aortic aneurysm (AAA), 30-34 mm diameter (HCC)   4. Dyslipidemia    PLAN:    In order of problems listed above:  1. Multiple complaints.  1 is weakness fatigue.  I will repeat echocardiogram to recheck left ventricle ejection fraction. 2. Essential hypertension blood pressure seems to be well controlled continue present management. 3. Dyslipidemia he is on high intense statin which I will continue cholesterol stable.   Like always it was a long visit.  We did speak 02/16/20 during the conversation.   Medication Adjustments/Labs and  Tests Ordered: Current medicines are reviewed at length with the patient today.  Concerns regarding medicines are outlined above.  No orders of the defined types were placed in this encounter.  Medication changes:  Meds ordered this encounter  Medications  . lisinopril (ZESTRIL) 2.5 MG tablet    Sig: TAKE 1 TABLET(2.5 MG) BY MOUTH DAILY    Dispense:  30 tablet    Refill:  4    Signed, [place], MD, [facility] 01/03/2021 1:39 PM    [facility]

## 2021-01-03 NOTE — Patient Instructions (Signed)
Medication Instructions:  Your physician recommends that you continue on your current medications as directed. Please refer to the Current Medication list given to you today.  *If you need a refill on your cardiac medications before your next appointment, please call your pharmacy*   Lab Work: None. If you have labs (blood work) drawn today and your tests are completely normal, you will receive your results only by: . MyChart Message (if you have MyChart) OR . A paper copy in the mail If you have any lab test that is abnormal or we need to change your treatment, we will call you to review the results.   Testing/Procedures: Your physician has requested that you have an echocardiogram. Echocardiography is a painless test that uses sound waves to create images of your heart. It provides your doctor with information about the size and shape of your heart and how well your heart's chambers and valves are working. This procedure takes approximately one hour. There are no restrictions for this procedure.     Follow-Up: At [facility], you and your health needs are our priority.  As part of our continuing mission to provide you with exceptional heart care, we have created designated Provider Care Teams.  These Care Teams include your primary Cardiologist (physician) and Advanced Practice Providers (APPs -  Physician Assistants and Nurse Practitioners) who all work together to provide you with the care you need, when you need it.  We recommend signing up for the patient portal called "MyChart".  Sign up information is provided on this After Visit Summary.  MyChart is used to connect with patients for Virtual Visits (Telemedicine).  Patients are able to view lab/test results, encounter notes, upcoming appointments, etc.  Non-urgent messages can be sent to your provider as well.   To learn more about what you can do with MyChart, go to [email or web address].    Your next appointment:   6  month(s)  The format for your next appointment:   In Person  Provider:   Robert Krasowski, MD   Other Instructions   Echocardiogram An echocardiogram is a test that uses sound waves (ultrasound) to produce images of the heart. Images from an echocardiogram can provide important information about:  Heart size and shape.  The size and thickness and movement of your heart's walls.  Heart muscle function and strength.  Heart valve function or if you have stenosis. Stenosis is when the heart valves are too narrow.  If blood is flowing backward through the heart valves (regurgitation).  A tumor or infectious growth around the heart valves.  Areas of heart muscle that are not working well because of poor blood flow or injury from a heart attack.  Aneurysm detection. An aneurysm is a weak or damaged part of an artery wall. The wall bulges out from the normal force of blood pumping through the body. Tell a health care provider about:  Any allergies you have.  All medicines you are taking, including vitamins, herbs, eye drops, creams, and over-the-counter medicines.  Any blood disorders you have.  Any surgeries you have had.  Any medical conditions you have.  Whether you are pregnant or may be pregnant. What are the risks? Generally, this is a safe test. However, problems may occur, including an allergic reaction to dye (contrast) that may be used during the test. What happens before the test? No specific preparation is needed. You may eat and drink normally. What happens during the test?  You will take off your   clothes from the waist up and put on a hospital gown.  Electrodes or electrocardiogram (ECG)patches may be placed on your chest. The electrodes or patches are then connected to a device that monitors your heart rate and rhythm.  You will lie down on a table for an ultrasound exam. A gel will be applied to your chest to help sound waves pass through your skin.  A  handheld device, called a transducer, will be pressed against your chest and moved over your heart. The transducer produces sound waves that travel to your heart and bounce back (or "echo" back) to the transducer. These sound waves will be captured in real-time and changed into images of your heart that can be viewed on a video monitor. The images will be recorded on a computer and reviewed by your health care provider.  You may be asked to change positions or hold your breath for a short time. This makes it easier to get different views or better views of your heart.  In some cases, you may receive contrast through an IV in one of your veins. This can improve the quality of the pictures from your heart. The procedure may vary among health care providers and hospitals.   What can I expect after the test? You may return to your normal, everyday life, including diet, activities, and medicines, unless your health care provider tells you not to do that. Follow these instructions at home:  It is up to you to get the results of your test. Ask your health care provider, or the department that is doing the test, when your results will be ready.  Keep all follow-up visits. This is important. Summary  An echocardiogram is a test that uses sound waves (ultrasound) to produce images of the heart.  Images from an echocardiogram can provide important information about the size and shape of your heart, heart muscle function, heart valve function, and other possible heart problems.  You do not need to do anything to prepare before this test. You may eat and drink normally.  After the echocardiogram is completed, you may return to your normal, everyday life, unless your health care provider tells you not to do that. This information is not intended to replace advice given to you by your health care provider. Make sure you discuss any questions you have with your health care provider. Document Revised:  06/05/2020 Document Reviewed: 06/05/2020 Elsevier Patient Education  2021 Elsevier Inc.   

## 2021-02-06 ENCOUNTER — Ambulatory Visit (HOSPITAL_BASED_OUTPATIENT_CLINIC_OR_DEPARTMENT_OTHER)
Admission: RE | Admit: 2021-02-06 | Discharge: 2021-02-06 | Disposition: A | Discharge status: Home / Self Care | Payer: Medicare Other | Source: Ambulatory Visit | Attending: Cardiology | Admitting: Cardiology

## 2021-02-06 ENCOUNTER — Other Ambulatory Visit: Payer: Self-pay

## 2021-02-06 DIAGNOSIS — I714 Abdominal aortic aneurysm, without rupture, unspecified: Secondary | ICD-10-CM

## 2021-02-06 DIAGNOSIS — E785 Hyperlipidemia, unspecified: Secondary | ICD-10-CM | POA: Insufficient documentation

## 2021-02-06 DIAGNOSIS — I10 Essential (primary) hypertension: Secondary | ICD-10-CM | POA: Diagnosis present

## 2021-02-06 LAB — ECHOCARDIOGRAM COMPLETE
Area-P 1/2: 2.07 cm2
S' Lateral: 3.25 cm

## 2021-02-08 ENCOUNTER — Telehealth: Payer: Self-pay | Admitting: Cardiology

## 2021-02-08 NOTE — Telephone Encounter (Signed)
Tom, Son of the patient was calling to have the Nurse go over the patient's Echo results

## 2021-02-08 NOTE — Telephone Encounter (Signed)
Called patient son back informed him of results.

## 2021-07-12 ENCOUNTER — Other Ambulatory Visit: Payer: Self-pay

## 2021-07-12 MED ORDER — ALPRAZOLAM 0.25 MG PO TABS: 0.2500 mg | ORAL_TABLET | ORAL | 0 refills | Status: DC | PRN

## 2021-07-12 NOTE — Progress Notes (Signed)
Prescription sent to pharmacy.

## 2021-07-13 IMAGING — CT CT ANGIO ABDOMEN
2 of 8 series · 12 of 46 positions shown, 14 images · IV contrast (Omnipaque)
Comparison: CT abdomen and pelvis-11/11/2018; 12/26/2016; aortic
ultrasound-09/09/2018

CLINICAL DATA: Evaluate abdominal aortic aneurysm.

EXAM:
CT ANGIOGRAPHY ABDOMEN
TECHNIQUE: Multidetector CT imaging of the abdomen was performed using the
standard protocol during bolus administration of intravenous
contrast. Multiplanar reconstructed images and MIPs were obtained
and reviewed to evaluate the vascular anatomy.
CONTRAST:  100mL OMNIPAQUE IOHEXOL 350 MG/ML SOLN

[Series 10: axial venous · axial · portal-venous · 0.86mm/px · z∈[-276,-46]mm · 10 of 58 slices shown, 12 images]
[im 6/58  soft-tissue]
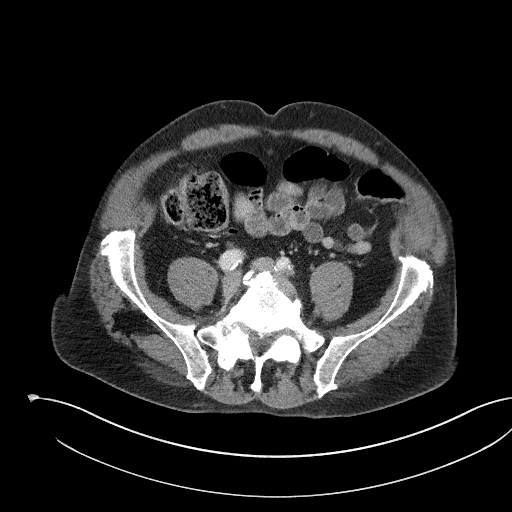
[im 6/58  bone]
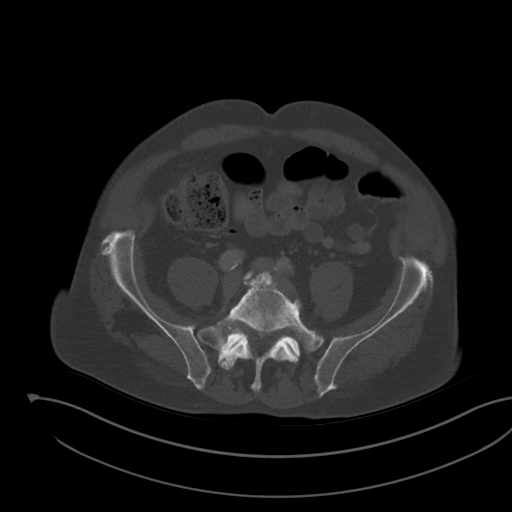
[im 11/58  soft-tissue]
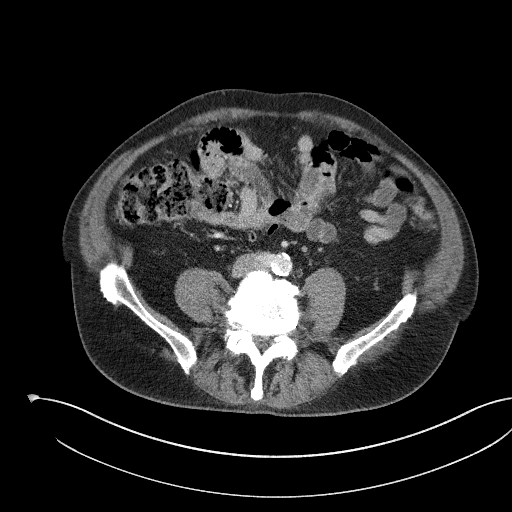
[im 16/58  soft-tissue]
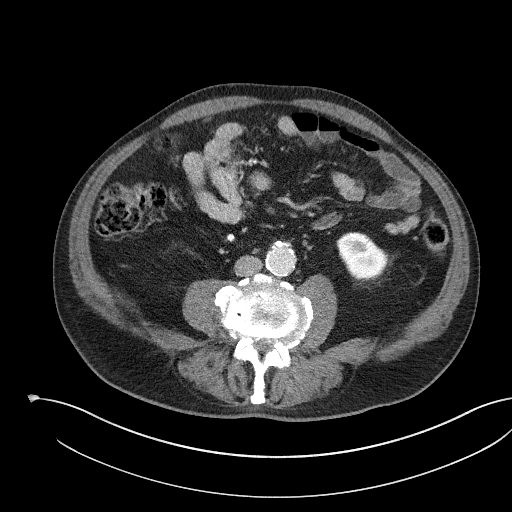
[im 21/58  soft-tissue]
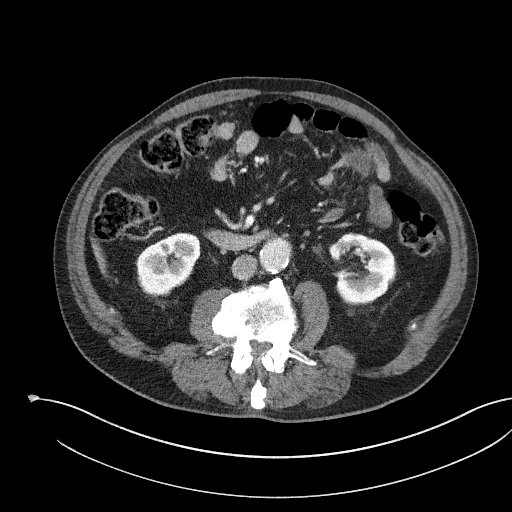
[im 26/58  soft-tissue]
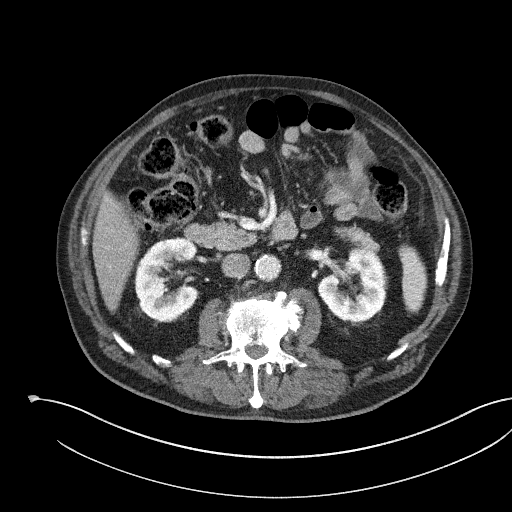
[im 32/58  soft-tissue]
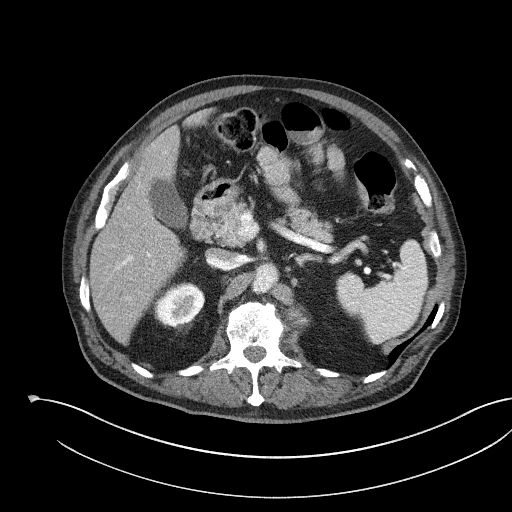
[im 37/58  soft-tissue]
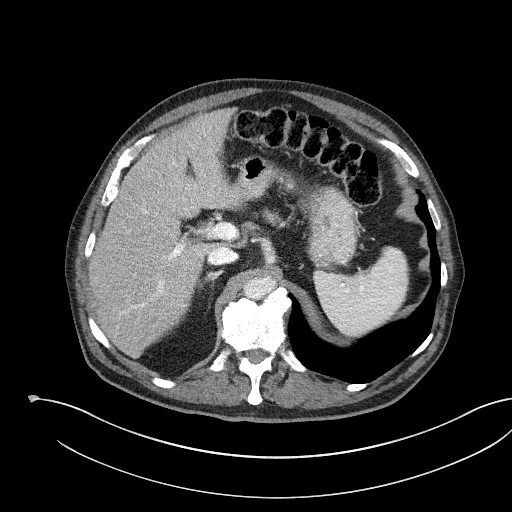
[im 42/58  soft-tissue]
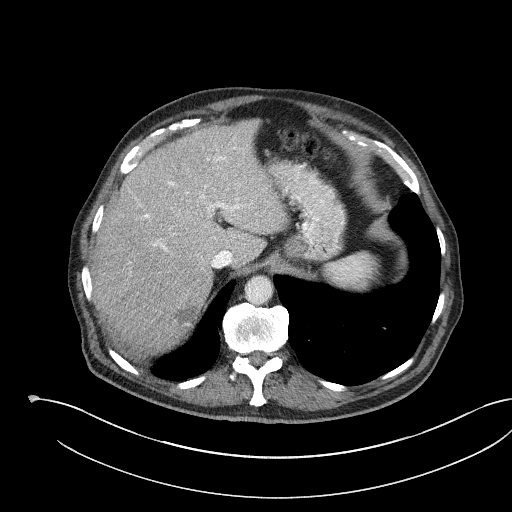
[im 47/58  soft-tissue]
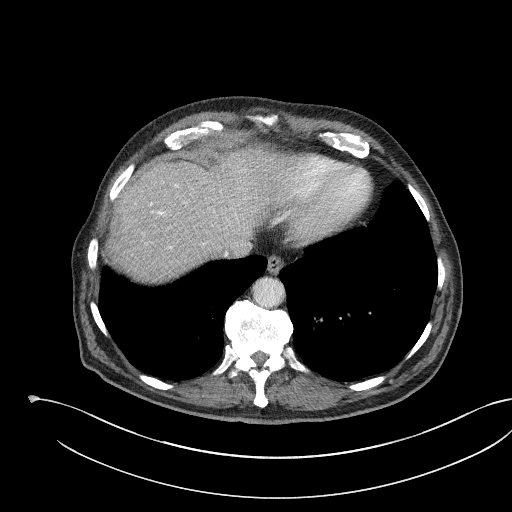
[im 47/58  bone]
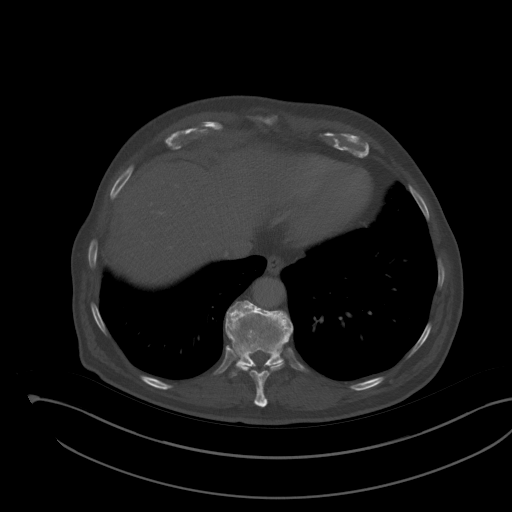
[im 52/58  soft-tissue]
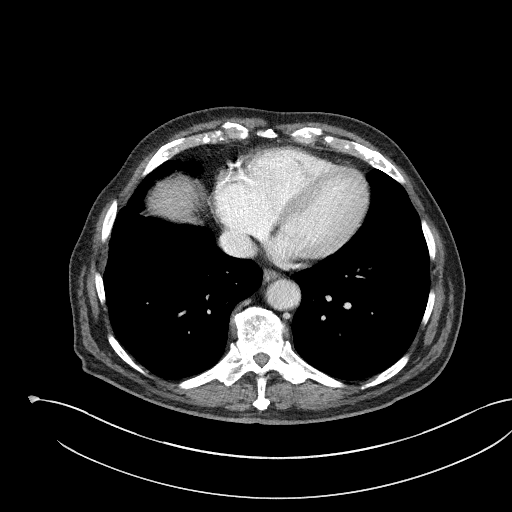

[Series 11: coronals · coronal · 0.62mm/px · 2 of 111 slices shown]
[im 37/111  soft-tissue]
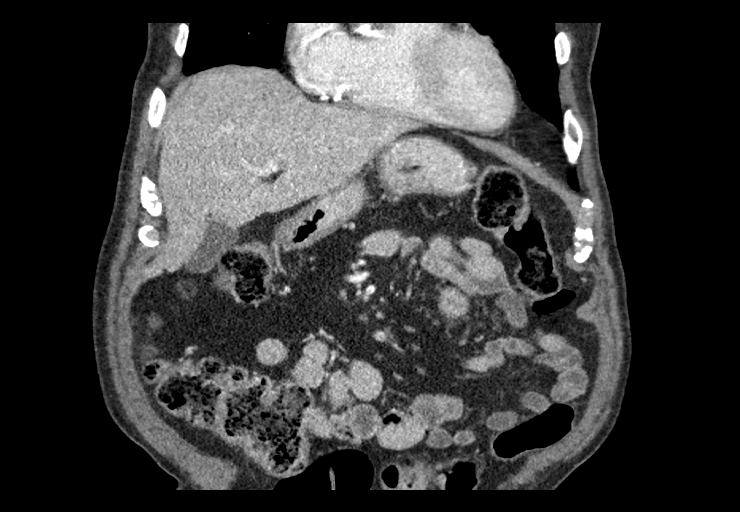
[im 74/111  soft-tissue]
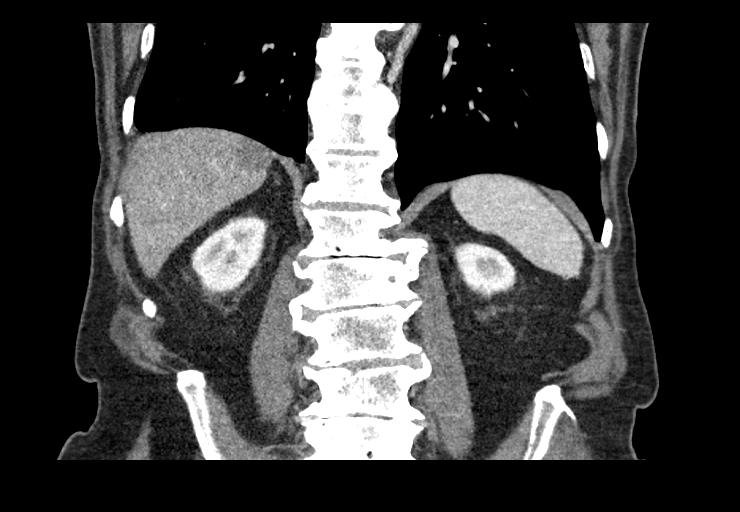

[12 of 46 positions shown; findings below may reference images not displayed]

FINDINGS: VASCULAR

Aorta: Mild fusiform aneurysmal dilatation of the infrarenal
abdominal aorta measuring 3.0 x 3.1 x 3.4 cm as measured in greatest
oblique short axis axial (image 66, series 4), coronal (image 51,
series 6) and sagittal (64, series 8), dimensions respectively,
grossly unchanged compared to the [DATE] examination, though note,
that examination did not include coronal and sagittal reformatted
images.

The cranial aspect of the aneurysmal segment of the infrarenal
abdominal aorta originates approximately 3.6 cm caudal to the
take-off of the left renal artery and extends approximately 2.2 cm
above the aortic bifurcation (to the level of the takeoff of the
IMA). No evidence of abdominal aortic dissection or periaortic
stranding.

Celiac: There is a moderate amount plaque involving of eccentric the
origin the celiac artery which results in approximately 50% luminal
narrowing (axial image 36, series 104), with associated mild
poststenotic dilatation of the distal aspect of the main trunk of
the celiac artery measuring approximately 1.2 cm in diameter.
Conventional configuration of the celiac trunk.

SMA: There is a minimal amount of noncalcified atherosclerotic
plaque involving the main trunk of the SMA, not definitely resulting
in a hemodynamically significant stenosis. Conventional branching
pattern. The distal tributaries of the SMA appear widely patent
without discrete intraluminal filling defect to suggest distal
embolism.

Renals: The right-sided renal artery is duplicated with a tiny
accessory right renal artery which supplies the inferior pole of the
right kidney.

Both dominant renal arteries are widely patent without
hemodynamically significant narrowing. No vessel irregularity to
suggest FMD.

IMA: Remains patent.

Inflow: Mild ectasia of the right common iliac artery measuring
cm in diameter.

Moderate amount of irregular mixed calcified and noncalcified
atherosclerotic plaque involving the left common iliac artery
approaches 50% luminal narrowing (axial image 88, series 4),
potentially accentuated due to vessel tortuosity at this location,
grossly unchanged compared to the [DATE] examination.

Veins: The IVC and pelvic venous systems appear patent on this
arterial phase examination.

Review of the MIP images confirms the above findings.

_________________________________________________________

NON-VASCULAR

Lower chest: Limited visualization of the lower thorax demonstrates
minimal subsegmental atelectasis within the right costophrenic
angle. No discrete focal airspace opacities. No pleural effusion.

Normal heart size.  Coronary artery calcifications.

Hepatobiliary: Normal hepatic contour. There is diffuse decreased
attenuation of the hepatic parenchyma on this postcontrast
examination suggestive of hepatic steatosis. There is an
approximately 2.0 cm hypoattenuating lesion within the dome of the
right lobe of the liver (image 18, series 10) which is grossly
unchanged compared to the [DATE] examination and as it demonstrates
peripheral nodular interrupted enhancement is favored to represent a
benign hepatic hemangioma. No discrete worrisome hepatic lesions.
Normal appearance of the gallbladder given degree distention. No
radiopaque gallstones. No intra or extrahepatic biliary ductal
dilatation. No ascites.

Pancreas: Normal appearance of the pancreas.

Spleen: Normal appearance of the spleen.

Adrenals/Urinary Tract: There is symmetric enhancement of the
bilateral kidneys. Solitary punctate (3-4 mm) nonobstructing stones
are seen bilaterally (both renal stones seen on image 32, series
10). Bilateral subcentimeter hypoattenuating renal lesions are too
small to accurately characterize though favored to represent renal
cysts. There is a minimal amount of likely age and body habitus
related bilateral perinephric stranding. No urinary obstruction.
Normal appearance the bilateral adrenal glands. The urinary bladder
was not imaged.

Stomach/Bowel: Moderate to large colonic stool burden without
evidence of enteric obstruction. Normal appearance of the terminal
ileum and appendix. No discrete areas of bowel wall thickening. No
pneumoperitoneum, pneumatosis or portal venous gas.

Lymphatic: No bulky retroperitoneal or mesenteric lymphadenopathy.

Other: There is a minimal amount of subcutaneous edema about the
midline of the low back.

Musculoskeletal: No acute or aggressive osseous abnormalities.
Stigmata of DISH within the thoracic spine. Moderate to severe
multilevel lumbar spine DDD, worse at L1-L2, L2-L3 and L4-L5 with
disc space height loss, endplate irregularity and sclerosis.
IMPRESSION: 1. Stable mild fusiform aneurysmal dilatation of the infrarenal
abdominal aorta measuring 3.4 cm in maximal diameter, grossly
unchanged compared to the [DATE] examination. Recommend follow-up
aortic ultrasound in 3 years. This recommendation follows ACR
consensus guidelines: White Paper of the ACR Incidental Findings
Committee II on Vascular Findings. [HOSPITAL] 5745;
[DATE]. Aortic aneurysm NOS (777SP-GLG.Y).
2. Moderate amount of slightly irregular mixed calcified and
noncalcified plaque results in approximately 50% luminal narrowing
of the left common iliac artery, potentially accentuated due to
vessel tortuosity.
3. Bilateral nonobstructing nephrolithiasis.
4. Suspected hepatic steatosis.  Correlation with LFTs is advised.

## 2021-08-16 ENCOUNTER — Ambulatory Visit: Payer: Medicare Other | Admitting: Cardiology

## 2021-08-16 ENCOUNTER — Encounter: Payer: Self-pay | Admitting: Cardiology

## 2021-08-16 ENCOUNTER — Other Ambulatory Visit: Payer: Self-pay

## 2021-08-16 VITALS — BP 146/60 | HR 60 | Ht 68.0 in | Wt 173.0 lb

## 2021-08-16 DIAGNOSIS — I714 Abdominal aortic aneurysm, without rupture, unspecified: Secondary | ICD-10-CM | POA: Diagnosis not present

## 2021-08-16 DIAGNOSIS — I2510 Atherosclerotic heart disease of native coronary artery without angina pectoris: Secondary | ICD-10-CM | POA: Diagnosis not present

## 2021-08-16 DIAGNOSIS — I10 Essential (primary) hypertension: Secondary | ICD-10-CM | POA: Diagnosis not present

## 2021-08-16 NOTE — Progress Notes (Signed)
Cardiology Office Note:    Date:  08/16/2021   ID:  Stanley Cain, DOB 1932/11/22, MRN [id number]  PCP:  Andreas Blower., MD  Cardiologist:  Gypsy Balsam, MD    Referring MD: Andreas Blower., MD   Chief Complaint  Patient presents with   Medication Management    History of Present Illness:    Stanley Cain is a 87 y.o. male with very complex past medical history.  He does have abdominal arctic aneurysm last checked 3.7 cm, coronary artery disease with PTCA and stenting long time ago, essential hypertension, dyslipidemia.  He comes today to my office like always with his wife.  He has a lot of nonspecific complaint he just does not feel well I suspect depression may play significant role here.  Would like to situation much more complicated is the fact that there is total confusion about this medication.  He does not have any idea what he is taking his wife does not know if he is said that he got the entire bag of medications he brought to medications for me today to look at 1 his carvedilol 3.125 twice daily and second was atorvastatin he said he does not take this medication after admit it is a very confusing situation.  Past Medical History:  Diagnosis Date   Abdominal aortic aneurysm (AAA), 30-34 mm diameter 05/17/2015   Chronic constipation 11/01/2015   Coronary artery stenosis 05/16/2015   Cortical age-related cataract of both eyes 04/15/2018   Diarrhea 07/27/2018   Dyslipidemia 05/17/2015   Essential hypertension 05/17/2015   GAD (generalized anxiety disorder) 09/14/2019   Gastroesophageal reflux disease without esophagitis 10/25/2016   Glenohumeral arthritis 07/07/2014   History of colon polyps 11/01/2015   Hyperlipidemia    Hypertension    Lymphocytic colitis 10/28/2011   Memory loss 05/03/2020   Other spondylosis with radiculopathy, lumbar region 05/10/2018   Peripheral vascular disease (HCC) 05/17/2015   Sacroiliac joint pain 05/10/2018   Senile nuclear sclerosis 04/15/2018    Formatting of this note might be different from the original. Added automatically from request for surgery [id number]   Sepsis (HCC) 10/25/2016   Spinal stenosis of lumbar region with neurogenic claudication 02/08/2018   Vitamin B12 deficiency 05/13/2019   Weakness 07/21/2017    Past Surgical History:  Procedure Laterality Date   SHOULDER SURGERY      Current Medications: Current Meds  Medication Sig   acetaminophen (TYLENOL) 500 MG tablet Take 500 mg by mouth as needed for moderate pain.   ALPRAZolam (XANAX) 0.25 MG tablet Take 1 tablet (0.25 mg total) by mouth as needed for anxiety.   aspirin EC 81 MG tablet Take 81 mg by mouth daily.   carvedilol (COREG) 3.125 MG tablet Take 1 tablet by mouth daily. Patient decrease from 1 bid to 1 qd   clopidogrel (PLAVIX) 75 MG tablet Take 75 mg by mouth daily.   lisinopril (ZESTRIL) 2.5 MG tablet TAKE 1 TABLET(2.5 MG) BY MOUTH DAILY (Patient taking differently: Take 2.5 mg by mouth daily. TAKE 1 TABLET(2.5 MG) BY MOUTH DAILY)   pantoprazole (PROTONIX) 40 MG tablet Take 40 mg by mouth daily.   pregabalin (LYRICA) 50 MG capsule Take 50 mg by mouth 2 (two) times daily.   rosuvastatin (CRESTOR) 40 MG tablet Take 40 mg by mouth daily.     Allergies:   Ciprofloxacin, Duloxetine, Enalapril, Metaxalone, Niacin, and Penicillins   Social History   Socioeconomic History   Marital status: Married    Spouse name: Not  on file   Number of children: Not on file   Years of education: Not on file   Highest education level: Not on file  Occupational History   Not on file  Tobacco Use   Smoking status: Former   Smokeless tobacco: Never  Vaping Use   Vaping Use: Never used  Substance and Sexual Activity   Alcohol use: Yes   Drug use: No   Sexual activity: Not on file  Other Topics Concern   Not on file  Social History Narrative   Not on file   Social Determinants of Health   Financial Resource Strain: Not on file  Food Insecurity: Not on file   Transportation Needs: Not on file  Physical Activity: Not on file  Stress: Not on file  Social Connections: Not on file     Family History: The patient's family history includes Cancer in his father. ROS:   Please see the history of present illness.    All 14 point review of systems negative except as described per history of present illness  EKGs/Labs/Other Studies Reviewed:      Recent Labs: No results found for requested labs within last 8760 hours.  Recent Lipid Panel No results found for: CHOL, TRIG, HDL, CHOLHDL, VLDL, LDLCALC, LDLDIRECT  Physical Exam:    VS:  BP (!) 146/60 (BP Location: Right Arm, Patient Position: Sitting)   Pulse 60   Ht 5\' 8"  (1.727 m)   Wt 173 lb (78.5 kg)   SpO2 95%   BMI 26.30 kg/m     Wt Readings from Last 3 Encounters:  08/16/21 173 lb (78.5 kg)  01/03/21 173 lb (78.5 kg)  06/20/20 177 lb (80.3 kg)     GEN:  Well nourished, well developed in no acute distress HEENT: Normal NECK: No JVD; No carotid bruits LYMPHATICS: No lymphadenopathy CARDIAC: RRR, no murmurs, no rubs, no gallops RESPIRATORY:  Clear to auscultation without rales, wheezing or rhonchi  ABDOMEN: Soft, non-tender, non-distended MUSCULOSKELETAL:  No edema; No deformity  SKIN: Warm and dry LOWER EXTREMITIES: no swelling NEUROLOGIC:  Alert and oriented x 3 PSYCHIATRIC:  Normal affect   ASSESSMENT:    1. Coronary artery stenosis   2. Abdominal aortic aneurysm (AAA), 30-34 mm diameter   3. Essential hypertension    PLAN:    In order of problems listed above:  Coronary disease stable denies have any chest pain tightness squeezing pressure burning chest Abdominal TCAR was next time I see him we will schedule him to have abdominal ultrasound. Essential hypertension blood pressure seems to be elevated today but again I have no idea what medication he takes Issue with compliance.  He does not know what medication he takes ask him to bring all medication to me next  time which will be within 2weeks second clarify at this quite confusing situation   Medication Adjustments/Labs and Tests Ordered: Current medicines are reviewed at length with the patient today.  Concerns regarding medicines are outlined above.  Orders Placed This Encounter  Procedures   EKG 12-Lead   Medication changes: No orders of the defined types were placed in this encounter.   Signed, 06/22/20, MD, [facility] 08/16/2021 2:34 PM    [facility]

## 2021-08-16 NOTE — Patient Instructions (Signed)
Medication Instructions:  Your physician recommends that you continue on your current medications as directed. Please refer to the Current Medication list given to you today.  *If you need a refill on your cardiac medications before your next appointment, please call your pharmacy*   Lab Work: None If you have labs (blood work) drawn today and your tests are completely normal, you will receive your results only by: MyChart Message (if you have MyChart) OR A paper copy in the mail If you have any lab test that is abnormal or we need to change your treatment, we will call you to review the results.   Testing/Procedures: None   Follow-Up: At [facility], you and your health needs are our priority.  As part of our continuing mission to provide you with exceptional heart care, we have created designated Provider Care Teams.  These Care Teams include your primary Cardiologist (physician) and Advanced Practice Providers (APPs -  Physician Assistants and Nurse Practitioners) who all work together to provide you with the care you need, when you need it.  We recommend signing up for the patient portal called "MyChart".  Sign up information is provided on this After Visit Summary.  MyChart is used to connect with patients for Virtual Visits (Telemedicine).  Patients are able to view lab/test results, encounter notes, upcoming appointments, etc.  Non-urgent messages can be sent to your provider as well.   To learn more about what you can do with MyChart, go to [email or web address].    Your next appointment:   Next available appointment with Dr. Bing Matter

## 2021-08-28 ENCOUNTER — Ambulatory Visit: Payer: Medicare Other | Admitting: Cardiology

## 2021-08-28 ENCOUNTER — Other Ambulatory Visit: Payer: Self-pay

## 2021-08-28 ENCOUNTER — Encounter: Payer: Self-pay | Admitting: Cardiology

## 2021-08-28 VITALS — BP 142/74 | Ht 68.0 in | Wt 173.0 lb

## 2021-08-28 DIAGNOSIS — E782 Mixed hyperlipidemia: Secondary | ICD-10-CM

## 2021-08-28 DIAGNOSIS — I10 Essential (primary) hypertension: Secondary | ICD-10-CM

## 2021-08-28 DIAGNOSIS — I2510 Atherosclerotic heart disease of native coronary artery without angina pectoris: Secondary | ICD-10-CM | POA: Diagnosis not present

## 2021-08-28 DIAGNOSIS — I714 Abdominal aortic aneurysm, without rupture, unspecified: Secondary | ICD-10-CM | POA: Diagnosis not present

## 2021-08-28 MED ORDER — AMLODIPINE BESYLATE 2.5 MG PO TABS: 2.5000 mg | ORAL_TABLET | Freq: Every day | ORAL | 1 refills | Status: DC

## 2021-08-28 NOTE — Addendum Note (Signed)
Addended by: Heywood Bene on: 08/28/2021 03:10 PM   Modules accepted: Orders

## 2021-08-28 NOTE — Progress Notes (Signed)
Cardiology Office Note:    Date:  08/28/2021   ID:  Stanley Cain, DOB [birth year], MRN [id number]  PCP:  Andreas Blower., MD  Cardiologist:  Gypsy Balsam, MD    Referring MD: Andreas Blower., MD   Chief Complaint  Patient presents with   Follow-up  Still complaining of being weak and tired  History of Present Illness:    Stanley Cain is a 87 y.o. male with past medical history significant for coronary artery disease status post PTCA and stenting done long time ago, essential hypertension, dyslipidemia, abdominal aortic aneurysm measuring 3.7 cm in last measurements.  To be a lot of confusion about his medication he was unable to tell me last time what medication he takes.  Ask him to bring all medications to me today.  And we reviewed dose medications.  Overall he still complain of having the same issues tired exhausted not feeling well.  Past Medical History:  Diagnosis Date   Abdominal aortic aneurysm (AAA), 30-34 mm diameter 05/17/2015   Chronic constipation 11/01/2015   Coronary artery stenosis 05/16/2015   Cortical age-related cataract of both eyes 04/15/2018   Diarrhea 07/27/2018   Dyslipidemia 05/17/2015   Essential hypertension 05/17/2015   GAD (generalized anxiety disorder) 09/14/2019   Gastroesophageal reflux disease without esophagitis 10/25/2016   Glenohumeral arthritis 07/07/2014   History of colon polyps 11/01/2015   Hyperlipidemia    Hypertension    Lymphocytic colitis 10/28/2011   Memory loss 05/03/2020   Other spondylosis with radiculopathy, lumbar region 05/10/2018   Peripheral vascular disease (HCC) 05/17/2015   Sacroiliac joint pain 05/10/2018   Senile nuclear sclerosis 04/15/2018   Formatting of this note might be different from the original. Added automatically from request for surgery [id number]   Sepsis (HCC) 10/25/2016   Spinal stenosis of lumbar region with neurogenic claudication 02/08/2018   Vitamin B12 deficiency 05/13/2019   Weakness 07/21/2017    Past  Surgical History:  Procedure Laterality Date   SHOULDER SURGERY      Current Medications: Current Meds  Medication Sig   acetaminophen (TYLENOL) 500 MG tablet Take 500 mg by mouth as needed for moderate pain.   aspirin EC 81 MG tablet Take 81 mg by mouth daily.   carvedilol (COREG) 3.125 MG tablet Take 1 tablet by mouth daily. Patient decrease from 1 bid to 1 qd   clopidogrel (PLAVIX) 75 MG tablet Take 75 mg by mouth daily.   DULoxetine (CYMBALTA) 20 MG capsule Take 20 mg by mouth daily.   lisinopril (ZESTRIL) 2.5 MG tablet TAKE 1 TABLET(2.5 MG) BY MOUTH DAILY (Patient taking differently: Take 2.5 mg by mouth daily. TAKE 1 TABLET(2.5 MG) BY MOUTH DAILY)   pantoprazole (PROTONIX) 40 MG tablet Take 40 mg by mouth daily.   rosuvastatin (CRESTOR) 40 MG tablet Take 40 mg by mouth daily.   vitamin B-12 (CYANOCOBALAMIN) 1000 MCG tablet Take 1,000 mcg by mouth daily.   VITAMIN D PO Take 1 tablet by mouth daily. Unknown strength     Allergies:   Penicillins, Ciprofloxacin, Duloxetine, Enalapril, Metaxalone, and Niacin   Social History   Socioeconomic History   Marital status: Married    Spouse name: Not on file   Number of children: Not on file   Years of education: Not on file   Highest education level: Not on file  Occupational History   Not on file  Tobacco Use   Smoking status: Former   Smokeless tobacco: Never  Vaping Use   Vaping Use:  Never used  Substance and Sexual Activity   Alcohol use: Yes   Drug use: No   Sexual activity: Not on file  Other Topics Concern   Not on file  Social History Narrative   Not on file   Social Determinants of Health   Financial Resource Strain: Not on file  Food Insecurity: Not on file  Transportation Needs: Not on file  Physical Activity: Not on file  Stress: Not on file  Social Connections: Not on file     Family History: The patient's family history includes Cancer in his father. ROS:   Please see the history of present illness.     All 14 point review of systems negative except as described per history of present illness  EKGs/Labs/Other Studies Reviewed:      Recent Labs: No results found for requested labs within last 8760 hours.  Recent Lipid Panel No results found for: CHOL, TRIG, HDL, CHOLHDL, VLDL, LDLCALC, LDLDIRECT  Physical Exam:    VS:  BP (!) 142/74   Ht 5\' 8"  (1.727 m)   Wt 173 lb (78.5 kg)   BMI 26.30 kg/m     Wt Readings from Last 3 Encounters:  08/28/21 173 lb (78.5 kg)  08/16/21 173 lb (78.5 kg)  01/03/21 173 lb (78.5 kg)     GEN:  Well nourished, well developed in no acute distress HEENT: Normal NECK: No JVD; No carotid bruits LYMPHATICS: No lymphadenopathy CARDIAC: RRR, no murmurs, no rubs, no gallops RESPIRATORY:  Clear to auscultation without rales, wheezing or rhonchi  ABDOMEN: Soft, non-tender, non-distended MUSCULOSKELETAL:  No edema; No deformity  SKIN: Warm and dry LOWER EXTREMITIES: no swelling NEUROLOGIC:  Alert and oriented x 3 PSYCHIATRIC:  Normal affect   ASSESSMENT:    1. Abdominal aortic aneurysm (AAA), 30-34 mm diameter   2. Coronary artery stenosis   3. Essential hypertension   4. Mixed hyperlipidemia    PLAN:    In order of problems listed above:  Coronary disease.  He symptomatology even though atypical could be related to coronary artery disease.  Therefore, I decided to put him on small dose of calcium channel blocker.  I will put him on 2.5 mg Norvasc.  I did review stress test from 2020 which showed no evidence of ischemia. Essential hypertension blood per seems to be controlled right now.  We will continue present management and plan is to amlodipine. Mixed dyslipidemia he is continue with statin he is on rosuvastatin however he brought medications to me today and he was also Lipitor.  We will discontinue Lipitor we will continue with rosuvastatin.   Medication Adjustments/Labs and Tests Ordered: Current medicines are reviewed at length with the  patient today.  Concerns regarding medicines are outlined above.  No orders of the defined types were placed in this encounter.  Medication changes: No orders of the defined types were placed in this encounter.   Signed, 2021, MD, [facility] 08/28/2021 2:55 PM    [facility]

## 2021-08-28 NOTE — Patient Instructions (Signed)
Medication Instructions:  Your physician has recommended you make the following change in your medication: Start Amlodipine 2.5 once daily  *If you need a refill on your cardiac medications before your next appointment, please call your pharmacy*   Lab Work: None If you have labs (blood work) drawn today and your tests are completely normal, you will receive your results only by: MyChart Message (if you have MyChart) OR A paper copy in the mail If you have any lab test that is abnormal or we need to change your treatment, we will call you to review the results.   Testing/Procedures: None   Follow-Up: At [facility], you and your health needs are our priority.  As part of our continuing mission to provide you with exceptional heart care, we have created designated Provider Care Teams.  These Care Teams include your primary Cardiologist (physician) and Advanced Practice Providers (APPs -  Physician Assistants and Nurse Practitioners) who all work together to provide you with the care you need, when you need it.  We recommend signing up for the patient portal called "MyChart".  Sign up information is provided on this After Visit Summary.  MyChart is used to connect with patients for Virtual Visits (Telemedicine).  Patients are able to view lab/test results, encounter notes, upcoming appointments, etc.  Non-urgent messages can be sent to your provider as well.   To learn more about what you can do with MyChart, go to [email or web address].    Your next appointment:   3 month(s)  The format for your next appointment:   In Person  Provider:   Gypsy Balsam, MD   Other Instructions

## 2021-11-01 ENCOUNTER — Other Ambulatory Visit: Payer: Self-pay | Admitting: Cardiology

## 2021-11-21 ENCOUNTER — Telehealth: Payer: Self-pay | Admitting: Cardiology

## 2021-11-21 NOTE — Telephone Encounter (Signed)
Patient hasn't been able to get medication refilled, there was a message sent back on Jan 6th regarding this. If you can give the patient a call back at [phone]

## 2021-11-22 MED ORDER — ALPRAZOLAM 0.25 MG PO TABS: 0.2500 mg | ORAL_TABLET | ORAL | 0 refills | Status: AC | PRN

## 2021-11-22 MED ORDER — ALPRAZOLAM 0.25 MG PO TABS: 0.2500 mg | ORAL_TABLET | ORAL | 0 refills | Status: DC | PRN

## 2021-11-22 NOTE — Telephone Encounter (Signed)
Pt requesting a refill on Alprazolam. How do you advise?

## 2021-11-22 NOTE — Telephone Encounter (Signed)
Spoke with patient's wife per DPR and advised her we could send 14 tablets of Xanax 0.5 mg but the next time he would have to get his PCP to fill this medication as this is not a cardiac medication that we normally refill.  She verbalized understanding and thanked me for the call.

## 2021-11-22 NOTE — Addendum Note (Signed)
Addended by: Zoiey Christy, Elmarie Shiley L on: 11/22/2021 11:54 AM   Modules accepted: Orders

## 2021-11-29 ENCOUNTER — Ambulatory Visit: Payer: Medicare Other | Admitting: Cardiology

## 2021-12-14 ENCOUNTER — Other Ambulatory Visit: Payer: Self-pay | Admitting: Cardiology

## 2021-12-19 ENCOUNTER — Ambulatory Visit: Payer: Medicare Other | Admitting: Cardiology

## 2021-12-19 ENCOUNTER — Encounter: Payer: Self-pay | Admitting: Cardiology

## 2021-12-19 ENCOUNTER — Other Ambulatory Visit: Payer: Self-pay

## 2021-12-19 VITALS — BP 134/66 | HR 64 | Ht 68.0 in | Wt 172.1 lb

## 2021-12-19 DIAGNOSIS — I739 Peripheral vascular disease, unspecified: Secondary | ICD-10-CM

## 2021-12-19 DIAGNOSIS — I10 Essential (primary) hypertension: Secondary | ICD-10-CM

## 2021-12-19 DIAGNOSIS — I2510 Atherosclerotic heart disease of native coronary artery without angina pectoris: Secondary | ICD-10-CM | POA: Diagnosis not present

## 2021-12-19 DIAGNOSIS — E785 Hyperlipidemia, unspecified: Secondary | ICD-10-CM

## 2021-12-19 DIAGNOSIS — I714 Abdominal aortic aneurysm, without rupture, unspecified: Secondary | ICD-10-CM

## 2021-12-19 NOTE — Patient Instructions (Signed)
Medication Instructions:  °Your physician recommends that you continue on your current medications as directed. Please refer to the Current Medication list given to you today. ° °*If you need a refill on your cardiac medications before your next appointment, please call your pharmacy* ° ° °Lab Work: °None °If you have labs (blood work) drawn today and your tests are completely normal, you will receive your results only by: °MyChart Message (if you have MyChart) OR °A paper copy in the mail °If you have any lab test that is abnormal or we need to change your treatment, we will call you to review the results. ° ° °Testing/Procedures: °Your physician has requested that you have an abdominal aorta duplex. During this test, an ultrasound is used to evaluate the aorta. Allow 30 minutes for this exam. Do not eat after midnight the day before and avoid carbonated beverages  ° ° °Follow-Up: °At [facility], you and your health needs are our priority.  As part of our continuing mission to provide you with exceptional heart care, we have created designated Provider Care Teams.  These Care Teams include your primary Cardiologist (physician) and Advanced Practice Providers (APPs -  Physician Assistants and Nurse Practitioners) who all work together to provide you with the care you need, when you need it. ° °We recommend signing up for the patient portal called "MyChart".  Sign up information is provided on this After Visit Summary.  MyChart is used to connect with patients for Virtual Visits (Telemedicine).  Patients are able to view lab/test results, encounter notes, upcoming appointments, etc.  Non-urgent messages can be sent to your provider as well.   °To learn more about what you can do with MyChart, go to [email or web address].   ° °Your next appointment:   °3 month(s) ° °The format for your next appointment:   °In Person ° °Provider:   °Robert Krasowski, MD  ° ° °Other Instructions °None ° °

## 2021-12-19 NOTE — Progress Notes (Signed)
Cardiology Office Note:    Date:  12/19/2021   ID:  Stanley Cain, DOB 1933/03/20, MRN [id number]  PCP:  Kristopher Glee., MD  Cardiologist:  Jenne Campus, MD    Referring MD: Kristopher Glee., MD   No chief complaint on file. Not feeling well  History of Present Illness:    Stanley Cain is a 88 y.o. male with past medical history significant for coronary artery disease, status post PTCA and stenting done a long time ago, essential hypertension, dyslipidemia, abdominal aortic aneurysm measuring 3.7 on last measurements, he comes today to my office like always having a lot of complaints biggest complaint is profound weakness and fatigue.  He said that anytime he tried to walk to get very tired when he come back after walking upand just cannot lay down and rest.  He denies have any chest pain tightness squeezing pressure burning chest he described the sensation as weakness in his chest.  The complaint he had for long time.  This sensation is clearly worse when he eats something.  Past Medical History:  Diagnosis Date   Abdominal aortic aneurysm (AAA), 30-34 mm diameter 05/17/2015   Chronic constipation 11/01/2015   Coronary artery stenosis 05/16/2015   Cortical age-related cataract of both eyes 04/15/2018   Diarrhea 07/27/2018   Dyslipidemia 05/17/2015   Essential hypertension 05/17/2015   GAD (generalized anxiety disorder) 09/14/2019   Gastroesophageal reflux disease without esophagitis 10/25/2016   Glenohumeral arthritis 07/07/2014   History of colon polyps 11/01/2015   Hyperlipidemia    Hypertension    Lymphocytic colitis 10/28/2011   Memory loss 05/03/2020   Other spondylosis with radiculopathy, lumbar region 05/10/2018   Peripheral vascular disease ([place]) 05/17/2015   Sacroiliac joint pain 05/10/2018   Senile nuclear sclerosis 04/15/2018   Formatting of this note might be different from the original. Added automatically from request for surgery [id number]   Sepsis ([place]) 10/25/2016    Spinal stenosis of lumbar region with neurogenic claudication 02/08/2018   Vitamin B12 deficiency 05/13/2019   Weakness 07/21/2017    Past Surgical History:  Procedure Laterality Date   SHOULDER SURGERY      Current Medications: Current Meds  Medication Sig   acetaminophen (TYLENOL) 500 MG tablet Take 500 mg by mouth as needed for moderate pain.   ALPRAZolam (XANAX) 0.25 MG tablet Take 1 tablet (0.25 mg total) by mouth as needed for anxiety.   amLODipine (NORVASC) 2.5 MG tablet TAKE 1 TABLET(2.5 MG) BY MOUTH DAILY   aspirin EC 81 MG tablet Take 81 mg by mouth daily.   carvedilol (COREG) 3.125 MG tablet Take 1 tablet by mouth daily. Patient decrease from 1 bid to 1 qd   clopidogrel (PLAVIX) 75 MG tablet Take 75 mg by mouth daily.   DULoxetine (CYMBALTA) 20 MG capsule Take 20 mg by mouth daily.   lisinopril (ZESTRIL) 2.5 MG tablet TAKE 1 TABLET(2.5 MG) BY MOUTH DAILY (Patient taking differently: Take 2.5 mg by mouth daily. TAKE 1 TABLET(2.5 MG) BY MOUTH DAILY)   pantoprazole (PROTONIX) 40 MG tablet Take 40 mg by mouth daily.   pregabalin (LYRICA) 50 MG capsule Take 50 mg by mouth 2 (two) times daily.   rosuvastatin (CRESTOR) 40 MG tablet Take 40 mg by mouth daily.   vitamin B-12 (CYANOCOBALAMIN) 1000 MCG tablet Take 1,000 mcg by mouth daily.   VITAMIN D PO Take 1 tablet by mouth daily. Unknown strength     Allergies:   Penicillins, Ciprofloxacin, Duloxetine, Enalapril, Metaxalone, and Niacin  Social History   Socioeconomic History   Marital status: Married    Spouse name: Not on file   Number of children: Not on file   Years of education: Not on file   Highest education level: Not on file  Occupational History   Not on file  Tobacco Use   Smoking status: Former    Passive exposure: Past   Smokeless tobacco: Never  Vaping Use   Vaping Use: Never used  Substance and Sexual Activity   Alcohol use: Yes   Drug use: No   Sexual activity: Not on file  Other Topics Concern    Not on file  Social History Narrative   Not on file   Social Determinants of Health   Financial Resource Strain: Not on file  Food Insecurity: Not on file  Transportation Needs: Not on file  Physical Activity: Not on file  Stress: Not on file  Social Connections: Not on file     Family History: The patient's family history includes Cancer in his father. ROS:   Please see the history of present illness.    All 14 point review of systems negative except as described per history of present illness  EKGs/Labs/Other Studies Reviewed:      Recent Labs: No results found for requested labs within last 8760 hours.  Recent Lipid Panel No results found for: CHOL, TRIG, HDL, CHOLHDL, VLDL, LDLCALC, LDLDIRECT  Physical Exam:    VS:  BP 134/66 (BP Location: Left Arm)    Pulse 64    Ht 5\' 8"  (1.727 m)    Wt 172 lb 1.3 oz (78.1 kg)    SpO2 96%    BMI 26.16 kg/m     Wt Readings from Last 3 Encounters:  12/19/21 172 lb 1.3 oz (78.1 kg)  08/28/21 173 lb (78.5 kg)  08/16/21 173 lb (78.5 kg)     GEN:  Well nourished, well developed in no acute distress HEENT: Normal NECK: No JVD; No carotid bruits LYMPHATICS: No lymphadenopathy CARDIAC: RRR, no murmurs, no rubs, no gallops RESPIRATORY:  Clear to auscultation without rales, wheezing or rhonchi  ABDOMEN: Soft, non-tender, non-distended MUSCULOSKELETAL:  No edema; No deformity  SKIN: Warm and dry LOWER EXTREMITIES: no swelling NEUROLOGIC:  Alert and oriented x 3 PSYCHIATRIC:  Normal affect   ASSESSMENT:    1. Abdominal aortic aneurysm (AAA), 30-34 mm diameter   2. Coronary artery stenosis   3. Primary hypertension   4. Peripheral vascular disease ([place])   5. Dyslipidemia    PLAN:    In order of problems listed above:  Abdominal aortic aneurysm I we will repeat ultrasounds to make sure it does not get any larger.  We will also try to look at his mesenteric arteries. Coronary artery disease: I wanted him to have a stress test  done even though his symptomatology is atypical I am concerned about reactivation of the problem however he want to postpone it.  He wants to see GI doctor first.  We will schedule him appointment for GI for potential upper GI and then if that is negative we will proceed with stress testing. Essential hypertension: Blood pressure well controlled continue present management. Dyslipidemia, chronic kidney disease taking Crestor, however I do not have latest fasting lipid profile.  We will call primary care physician to get copy of it. Overall is a difficult situation he had constellation of atypical symptoms multiple investigations prior did not show any permanent finding.  I think he can benefit from  seeing GI with consideration of upper GI.  If that is unrevealing stress test will be considered.   Medication Adjustments/Labs and Tests Ordered: Current medicines are reviewed at length with the patient today.  Concerns regarding medicines are outlined above.  Orders Placed This Encounter  Procedures   US AORTA   Ambulatory referral to Gastroenterology   Medication changes: No orders of the defined types were placed in this encounter.   Signed, Park Liter, MD, [facility] 12/19/2021 4:10 PM    [facility]

## 2021-12-23 ENCOUNTER — Encounter: Payer: Self-pay | Admitting: Gastroenterology

## 2022-01-07 ENCOUNTER — Ambulatory Visit (HOSPITAL_BASED_OUTPATIENT_CLINIC_OR_DEPARTMENT_OTHER): Payer: Medicare Other

## 2022-01-10 ENCOUNTER — Other Ambulatory Visit: Payer: Self-pay

## 2022-01-10 ENCOUNTER — Ambulatory Visit (HOSPITAL_BASED_OUTPATIENT_CLINIC_OR_DEPARTMENT_OTHER)
Admission: RE | Admit: 2022-01-10 | Discharge: 2022-01-10 | Disposition: A | Discharge status: Home / Self Care | Payer: Medicare Other | Source: Ambulatory Visit | Attending: Cardiology | Admitting: Cardiology

## 2022-01-10 DIAGNOSIS — I714 Abdominal aortic aneurysm, without rupture, unspecified: Secondary | ICD-10-CM | POA: Diagnosis not present

## 2022-01-16 ENCOUNTER — Other Ambulatory Visit: Payer: Self-pay

## 2022-01-16 ENCOUNTER — Ambulatory Visit (INDEPENDENT_AMBULATORY_CARE_PROVIDER_SITE_OTHER): Payer: Medicare Other | Admitting: Gastroenterology

## 2022-01-16 ENCOUNTER — Other Ambulatory Visit (INDEPENDENT_AMBULATORY_CARE_PROVIDER_SITE_OTHER): Payer: Medicare Other

## 2022-01-16 VITALS — BP 142/80 | HR 67 | Ht 68.0 in | Wt 172.1 lb

## 2022-01-16 DIAGNOSIS — K219 Gastro-esophageal reflux disease without esophagitis: Secondary | ICD-10-CM | POA: Diagnosis not present

## 2022-01-16 DIAGNOSIS — R1013 Epigastric pain: Secondary | ICD-10-CM

## 2022-01-16 LAB — CBC WITH DIFFERENTIAL/PLATELET
Basophils Absolute: 0 10*3/uL (ref 0.0–0.1)
Basophils Relative: 0 % (ref 0.0–3.0)
Eosinophils Absolute: 0 10*3/uL (ref 0.0–0.7)
Eosinophils Relative: 0 % (ref 0.0–5.0)
HCT: 41.6 % (ref 39.0–52.0)
Hemoglobin: 13.9 g/dL (ref 13.0–17.0)
Lymphocytes Relative: 10.4 % — ABNORMAL LOW (ref 12.0–46.0)
Lymphs Abs: 1.5 10*3/uL (ref 0.7–4.0)
MCHC: 33.4 g/dL (ref 30.0–36.0)
MCV: 88.6 fl (ref 78.0–100.0)
Monocytes Absolute: 0.9 10*3/uL (ref 0.1–1.0)
Monocytes Relative: 5.8 % (ref 3.0–12.0)
Neutro Abs: 12.5 10*3/uL — ABNORMAL HIGH (ref 1.4–7.7)
Neutrophils Relative %: 83.8 % — ABNORMAL HIGH (ref 43.0–77.0)
Platelets: 236 10*3/uL (ref 150.0–400.0)
RBC: 4.69 Mil/uL (ref 4.22–5.81)
RDW: 13.1 % (ref 11.5–15.5)
WBC: 14.9 10*3/uL — ABNORMAL HIGH (ref 4.0–10.5)

## 2022-01-16 LAB — C-REACTIVE PROTEIN: CRP: <1 mg/dL (ref 0.5–20.0)

## 2022-01-16 LAB — COMPREHENSIVE METABOLIC PANEL
ALT: 18 U/L (ref 0–53)
AST: 25 U/L (ref 0–37)
Albumin: 4.7 g/dL (ref 3.5–5.2)
Alkaline Phosphatase: 60 U/L (ref 39–117)
BUN: 32 mg/dL — ABNORMAL HIGH (ref 6–23)
CO2: 28 mEq/L (ref 19–32)
Calcium: 9.5 mg/dL (ref 8.4–10.5)
Chloride: 107 mEq/L (ref 96–112)
Creatinine, Ser: 1.01 mg/dL (ref 0.40–1.50)
GFR: 67.03 mL/min (ref >60.00)
Glucose, Bld: 142 mg/dL — ABNORMAL HIGH (ref 70–99)
Potassium: 4.8 mEq/L (ref 3.5–5.1)
Sodium: 142 mEq/L (ref 135–145)
Total Bilirubin: 0.6 mg/dL (ref 0.2–1.2)
Total Protein: 6.9 g/dL (ref 6.0–8.3)

## 2022-01-16 LAB — TSH: TSH: 1.48 u[IU]/mL (ref 0.35–5.50)

## 2022-01-16 NOTE — Patient Instructions (Addendum)
If you are age 86 or older, your body mass index should be between 23-30. Your Body mass index is 26.17 kg/m?Marland Kitchen If this is out of the aforementioned range listed, please consider follow up with your Primary Care Provider. ? ?If you are age 52 or younger, your body mass index should be between 19-25. Your Body mass index is 26.17 kg/m?Marland Kitchen If this is out of the aformentioned range listed, please consider follow up with your Primary Care Provider.  ? ?________________________________________________________ ? ?The [facility] providers would like to encourage you to use [facility] to communicate with providers for non-urgent requests or questions.  Due to long hold times on the telephone, sending your provider a message by [facility] may be a faster and more efficient way to get a response.  Please allow 48 business hours for a response.  Please remember that this is for non-urgent requests.  ?_______________________________________________________ ? ?Please go to the lab on the 2nd floor [address] before you leave the office today.  ? ?Continue protonix. ?Take tylenol 500mg  daily with breakfast. ? ?You will be contacted by our office prior to your procedure for directions on holding your Plavix.  If you do not hear from our office 1 week prior to your scheduled procedure, please call [phone] to discuss.  ? ?You have been scheduled for an endoscopy. Please follow written instructions given to you at your visit today. ?If you use inhalers (even only as needed), please bring them with you on the day of your procedure. ? ?Please call with any questions or concerns. ? ?Thank you, ? ?Dr. [phone] ? ? ? ? ? ?We want to thank you for trusting [facility] with your care. All of our staff and providers value the relationships we have built with our patients, and it is an honor to care for you.  ? ?We are writing to let you know that [facility] will close on Mar 10, 2022, and we  invite you to continue to see Dr. Mar 12, 2022 and Edman Circle at the [facility] [place] office location. We are consolidating our serices at these [facility] practices to better provide care. Our office staff will work with you to ensure a seamless transition.  ? ?[facility], DO -Dr. Doristine Locks will be movig to [facility] at [address] [place], [place], effective Mar 10, 2022.  Contact [phone] to schedule an appointment with him.  ? ?Mar 12, 2022, MD- Dr. Edman Circle will be movig to [facility] at [address] [place], [place], effective Mar 10, 2022.  Contact [phone] to schedule an appointment with him.  ? ?Requesting Medical Records ?If you need to request your medical records, please follow the instructions below. Your medical records are confidential, and a copy can be transferred to another provider or released to you or another person you designate only with your permission. ? ?There are several ways to request your medical records: ?Requests for medical records can be submitted through our practice.   ?You can also request your records electronically, in your MyChart account by selecting the ?Request Health Records? tab.  ?If you need additional information on how to request records, please go to Mar 12, 2022, choose Patient Information, then select Request Medical Records. ?To make an appointment or if you have any questions about your health care needs, please contact our office at [phone] and one of our staff members will be glad to assist you. ?[facility] is  committed to providing exceptional care for you and our community. Thank you for allowing [place] to serve your health care needs. ?Sincerely, ? ?Trixie Dredge, Director [facility] ?[facility] also offers convenient virtual care options. Sore throat? Sinus problems? Cold or flu symptoms? Get care from the comfort of home with [facility] Video  Visits and e-Visits. Learn more about the non-emergency conditions treated and start your virtual visit at [email or web address] ? ?

## 2022-01-16 NOTE — Progress Notes (Signed)
? ? ?Chief Complaint: GI problems ? ?Referring Provider:  Andreas Blower., MD    ? ? ?ASSESSMENT AND PLAN;  ? ?#1. Epi pain/discomfort ? ?#2. GERD ? ?Plan: ?-EGD off plavix x 5 days, after cardio clearence.  Can continue baby aspirin throughout. ?-Continue protonix ?-Take tylenol 500mg  qd with BF ?-CBC, CMP, S cortisol, TSH, celiac, CRP. ?-If above is neg, repeat CTA to check for mesenteric circulation. ? ?HPI:   ? ?Stanley Cain is a 88 y.o. male  ?With  CAD s/p PTCA and stenting done a long time ago on ASA/Plavix, HTN, stable AAA, HLD, PVD, DJD, generalized anxiety disorder ? ?Referred by Dr. 97 weakness and fatigue" after breakfast associated with mild epigastric discomfort and heartburn.  He has to lie down for about 30 minutes after breakfast. No N/V. No SOB. Dr Clearance Coots (cardiology) does not think it is related to his cardiac issues.  He is planning to do stress test if GI work-up is neg. ? ?No odynophagia or dysphagia.  No chest pains.  Has been taking Protonix. ? ?Never had EGD. ? ?Longstanding history of constipation for which she has been using herbal tea for years.  This is associated with abdominal bloating and generalized abdominal discomfort which does get better with BMs. ? ?He has not been drinking enough water. ? ?No recent weight loss.  Overall has good appetite. ? ?Past GI procedures ? ?CTA AP with contrast 11/2019: ?1. Stable mild fusiform aneurysmal dilatation of the infrarenal ?abdominal aorta measuring 3.4 cm in maximal diameter, grossly ?unchanged compared to 12/2016 ?2. Moderate amount of slightly irregular mixed calcified and ?noncalcified plaque results in approximately 50% luminal narrowing ?of the left common iliac artery. ?3. Bilateral nonobstructing nephrolithiasis. ?4. Suspected hepatic steatosis ?5. Moderate to large colonic stool burden without obstruction. ? ?-On review CTA 11/2019 does show 50% celiac narrowing, minimal SMA plaques 11/2019. ? ?12/2019 aorta 12/2021: 3.4 cm  AAA ? ?Colonoscopy: 07/2015- Dr 08/2015.  Internal hemorrhoids.  Biopsies negative for microscopic colitis.  No further colonoscopies recommended due to age. ? ? ?Past Medical History:  ?Diagnosis Date  ? Abdominal aortic aneurysm (AAA), 30-34 mm diameter 05/17/2015  ? Chronic constipation 11/01/2015  ? Coronary artery stenosis 05/16/2015  ? Cortical age-related cataract of both eyes 04/15/2018  ? Diarrhea 07/27/2018  ? Dyslipidemia 05/17/2015  ? Essential hypertension 05/17/2015  ? GAD (generalized anxiety disorder) 09/14/2019  ? Gastroesophageal reflux disease without esophagitis 10/25/2016  ? Glenohumeral arthritis 07/07/2014  ? History of colon polyps 11/01/2015  ? Hyperlipidemia   ? Hypertension   ? 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 Memory loss 05/03/2020  ? Other spondylosis with radiculopathy, lumbar region 05/10/2018  ? Peripheral vascular disease ([facility]) 05/17/2015  ? Sacroiliac joint pain 05/10/2018  ? Senile nuclear sclerosis 04/15/2018  ? Formatting of this note might be different from the original. Added automatically from request for surgery [id number]  ? Sepsis ([facility]) 10/25/2016  ? Spinal stenosis of lumbar region with neurogenic claudication 02/08/2018  ? Vitamin B12 deficiency 05/13/2019  ? Weakness 07/21/2017  ? ? ?Past Surgical History:  ?Procedure Laterality Date  ? SHOULDER SURGERY    ? ? ?Family History  ?Problem Relation Age of Onset  ? Cancer Father   ? ? ?Social History  ? ?Tobacco Use  ? Smoking status: Former  ?  Passive exposure: Past  ? Smokeless tobacco: Never  ?Vaping Use  ? Vaping Use: Never used  ?Substance Use Topics  ? Alcohol use: Yes  ? Drug use:  No  ? ? ?Current Outpatient Medications  ?Medication Sig Dispense Refill  ? acetaminophen (TYLENOL) 500 MG tablet Take 500 mg by mouth as needed for moderate pain.    ? ALPRAZolam (XANAX) 0.25 MG tablet Take 1 tablet (0.25 mg total) by mouth as needed for anxiety. 14 tablet 0  ? amLODipine (NORVASC) 2.5 MG tablet TAKE 1 TABLET(2.5 MG) BY MOUTH DAILY 90 tablet 1  ?  aspirin EC 81 MG tablet Take 81 mg by mouth daily.    ? carvedilol (COREG) 3.125 MG tablet Take 1 tablet by mouth daily. Patient decrease from 1 bid to 1 qd    ? clopidogrel (PLAVIX) 75 MG tablet Take 75 mg by mouth daily.    ? DULoxetine (CYMBALTA) 20 MG capsule Take 20 mg by mouth daily.    ? lisinopril (ZESTRIL) 2.5 MG tablet TAKE 1 TABLET(2.5 MG) BY MOUTH DAILY (Patient taking differently: Take 2.5 mg by mouth daily. TAKE 1 TABLET(2.5 MG) BY MOUTH DAILY) 30 tablet 4  ? pantoprazole (PROTONIX) 40 MG tablet Take 40 mg by mouth daily.    ? pregabalin (LYRICA) 50 MG capsule Take 50 mg by mouth 2 (two) times daily.    ? rosuvastatin (CRESTOR) 40 MG tablet Take 40 mg by mouth daily.    ? vitamin B-12 (CYANOCOBALAMIN) 1000 MCG tablet Take 1,000 mcg by mouth daily.    ? VITAMIN D PO Take 1 tablet by mouth daily. Unknown strength    ? ?No current facility-administered medications for this visit.  ? ? ?Allergies  ?Allergen Reactions  ? Penicillins   ? Ciprofloxacin   ?  Other reaction(s): Other (See Comments) ?UNKNOWN  ? Duloxetine   ?  PT REPORTS THAT HE FELT LIKE HE WAS GOING TO DIE AFTER HE TOOK THIS MED  ? Enalapril   ?  Other reaction(s): Other (See Comments) ?UNKNOWN  ? Metaxalone   ?  Other reaction(s): Other (See Comments) ?UNKNOWN  ? Niacin   ?  Other reaction(s): Other (See Comments) ?UNKNOWN  ? ? ?Review of Systems:  ?Constitutional: Denies fever, chills, diaphoresis, appetite change and has fatigue.  ?HEENT: Denies photophobia, eye pain, redness, hearing loss, ear pain, congestion, sore throat, rhinorrhea, sneezing, mouth sores, neck pain, neck stiffness and tinnitus.   ?Respiratory: Denies SOB, DOE, cough, chest tightness,  and wheezing.   ?Cardiovascular: Denies chest pain, palpitations and leg swelling.  ?Genitourinary: Denies dysuria, urgency, frequency, hematuria, flank pain and difficulty urinating.  ?Musculoskeletal: has myalgias, back pain, joint swelling, arthralgias and gait problem.  ?Skin: No rash.   ?Neurological: Denies dizziness, seizures, syncope, weakness, light-headedness, numbness and headaches.  ?Hematological: Denies adenopathy. Easy bruising, personal or family bleeding history  ?Psychiatric/Behavioral: Has anxiety or depression ? ?  ? ?Physical Exam:   ? ?BP (!) 142/80 (BP Location: Left Arm, Patient Position: Sitting, Cuff Size: Normal)   Pulse 67   Ht 5\' 8"  (1.727 m)   Wt 172 lb 2 oz (78.1 kg)   SpO2 97%   BMI 26.17 kg/m?  ?Wt Readings from Last 3 Encounters:  ?01/16/22 172 lb 2 oz (78.1 kg)  ?12/19/21 172 lb 1.3 oz (78.1 kg)  ?08/28/21 173 lb (78.5 kg)  ? ?Constitutional:  Well-developed, in no acute distress. ?Psychiatric: Normal mood and affect. Behavior is normal. ?HEENT: Pupils normal.  Conjunctivae are normal. No scleral icterus.  ?Cardiovascular: Normal rate, regular rhythm. No edema ?Pulmonary/chest: Effort normal and breath sounds normal. No wheezing, rales or rhonchi. ?Abdominal: Soft, nondistended. Nontender. Bowel sounds active  throughout. There are no masses palpable. No hepatomegaly. ?Rectal: Deferred ?Neurological: Alert and oriented to person place and time. ?Skin: Skin is warm and dry. No rashes noted. ? ?Data Reviewed: I have personally reviewed following labs and imaging studies ? ?CBC: ? ?  Latest Ref Rng & Units 06/02/2017  ? 11:11 AM  ?CBC  ?WBC 3.4 - 10.8 x10E3/uL 8.2    ?Hemoglobin 13.0 - 17.7 g/dL 14.2    ?Hematocrit 37.5 - 51.0 % 41.2    ?Platelets 150 - 379 x10E3/uL 231    ? ? ?CMP: ? ?  Latest Ref Rng & Units 12/22/2019  ? 10:19 AM 05/05/2019  ?  2:49 PM 06/02/2017  ? 11:11 AM  ?CMP  ?Glucose 65 - 99 mg/dL 395   320   233    ?BUN 8 - 27 mg/dL 18   16   19     ?Creatinine 0.76 - 1.27 mg/dL   4.35   6.86    ?Sodium 134 - 144 mmol/L 142   144   139    ?Potassium 3.5 - 5.2 mmol/L 5.0   4.6   5.0    ?Chloride 96 - 106 mmol/L 105   106   101    ?CO2 20 - 29 mmol/L 25   23   24     ?Calcium 8.6 - 10.2 mg/dL 9.6   9.5   9.7    ?Total Protein 6.0 - 8.5 g/dL  6.9   6.9    ?Total  Bilirubin 0.0 - 1.2 mg/dL  0.3   0.4    ?Alkaline Phos 39 - 117 IU/L  76   83    ?AST 0 - 40 IU/L  16   16    ?ALT 0 - 44 IU/L  13   14    ? ? ? ? ?Radiology Studies: ?1.68 AORTA ? ?Result Date: 01/13/2022 ?CLINICAL DAT

## 2022-01-19 LAB — CELIAC PANEL 10
Antigliadin Abs, IgA: 4 units (ref 0–19)
Endomysial IgA: NEGATIVE
Gliadin IgG: 3 units (ref 0–19)
IgA/Immunoglobulin A, Serum: 351 mg/dL (ref 61–437)
Tissue Transglut Ab: 6 U/mL — ABNORMAL HIGH (ref 0–5)
Transglutaminase IgA: <2 U/mL (ref 0–3)

## 2022-01-22 ENCOUNTER — Telehealth: Payer: Self-pay

## 2022-01-22 NOTE — Telephone Encounter (Signed)
? ?  [facility] Pre-operative Risk Assessment  ?  ?Request for surgical clearance: ? ?What type of surgery is being performed? Endoscopy   ? ?When is this surgery scheduled? 02/06/2022  ? ?What type of clearance is required (medical clearance vs. Pharmacy clearance to hold med vs. Both)? Pharmacy ? ?Are there any medications that need to be held prior to surgery and how long?Plavix to hold 5 days prior to the procedure.   ? ?Practice name and name of physician performing surgery? Dr. Yong Channel   ? ?What is your office phone number: [phone]  ?  ?7.   What is your office fax number: [phone] ? ?8.   Anesthesia type (None, local, MAC, general) ? Not specified  ? ? ?Stanley Cain Stanley Cain ?01/22/2022, 4:51 PM  ?_________________________________________________________________ ?  (provider comments below) ? ?

## 2022-01-23 ENCOUNTER — Telehealth: Payer: Self-pay

## 2022-01-23 NOTE — Telephone Encounter (Signed)
-----   Message from Georgeanna Lea, MD sent at 01/15/2022  7:39 PM EDT ----- ?Abdominal ultrasounds of the aorta did not show any significant enlargement ?

## 2022-01-23 NOTE — Telephone Encounter (Signed)
Informed the patient's wife to hold Plavix 5 days prior to his procedure. She voiced understanding. ?

## 2022-01-23 NOTE — Telephone Encounter (Signed)
Patient notified of results.

## 2022-01-23 NOTE — Telephone Encounter (Signed)
? ?  Primary Cardiologist: Gypsy Balsam, MD ? ?Chart reviewed as part of pre-operative protocol coverage. Given past medical history and time since last visit, based on ACC/AHA guidelines, Stanley Cain would be at acceptable risk for the planned procedure without further cardiovascular testing.  ? ?His Plavix may be held for 5 days prior to his procedure.  Please resume as soon as hemostasis is achieved.  His aspirin will need to be continued throughout the procedure. ? ?I will route this recommendation to the requesting party via Epic fax function and remove from pre-op pool. ? ?Please call with questions. ? ?Thomasene Ripple. Varnika Butz NP-C ? ?  ?01/23/2022, 8:30 AM ?[facility] ?[address] ?Office [phone] Fax [phone] ? ? ? ?

## 2022-01-25 LAB — CORTISOL, FREE: Cortisol Free, Ser: 0.04 ug/dL

## 2022-02-06 ENCOUNTER — Encounter: Payer: Self-pay | Admitting: Gastroenterology

## 2022-02-06 ENCOUNTER — Ambulatory Visit (AMBULATORY_SURGERY_CENTER): Payer: Medicare Other | Admitting: Gastroenterology

## 2022-02-06 VITALS — BP 128/66 | HR 58 | Temp 98.1°F | Resp 18 | Ht 68.0 in | Wt 172.0 lb

## 2022-02-06 DIAGNOSIS — K2970 Gastritis, unspecified, without bleeding: Secondary | ICD-10-CM | POA: Diagnosis not present

## 2022-02-06 DIAGNOSIS — K31A0 Gastric intestinal metaplasia, unspecified: Secondary | ICD-10-CM

## 2022-02-06 DIAGNOSIS — K2950 Unspecified chronic gastritis without bleeding: Secondary | ICD-10-CM | POA: Diagnosis not present

## 2022-02-06 DIAGNOSIS — K219 Gastro-esophageal reflux disease without esophagitis: Secondary | ICD-10-CM

## 2022-02-06 DIAGNOSIS — R1013 Epigastric pain: Secondary | ICD-10-CM | POA: Diagnosis not present

## 2022-02-06 MED ORDER — SODIUM CHLORIDE 0.9 % IV SOLN: 500.0000 mL | Freq: Once | INTRAVENOUS | Status: DC

## 2022-02-06 MED ORDER — OMEPRAZOLE 20 MG PO CPDR: 20.0000 mg | DELAYED_RELEASE_CAPSULE | Freq: Every day | ORAL | 4 refills | Status: AC

## 2022-02-06 NOTE — Progress Notes (Signed)
Pt's states no medical or surgical changes since previsit or office visit. 

## 2022-02-06 NOTE — Progress Notes (Signed)
Sedate, gd SR, tolerated procedure well, VSS, report to RN 

## 2022-02-06 NOTE — Progress Notes (Signed)
? ? ?Chief Complaint: GI problems ? ?Referring Provider:  Andreas Blower., MD    ? ? ?ASSESSMENT AND PLAN;  ? ?#1. Epi pain/discomfort ? ?#2. GERD ? ?Plan: ?-EGD off plavix x 5 days, after cardio clearence.  Can continue baby aspirin throughout. ?-Continue protonix ?-Take tylenol 500mg  qd with BF ?-CBC, CMP, S cortisol, TSH, celiac, CRP. ?-If above is neg, repeat CTA to check for mesenteric circulation. ? ? ?For EGD today ?No change ? ?HPI:   ? ?Stanley Cain is a 88 y.o. male  ?With  CAD s/p PTCA and stenting done a long time ago on ASA/Plavix, HTN, stable AAA, HLD, PVD, DJD, generalized anxiety disorder ? ?Referred by Dr. 97 weakness and fatigue" after breakfast associated with mild epigastric discomfort and heartburn.  He has to lie down for about 30 minutes after breakfast. No N/V. No SOB. Dr Clearance Coots (cardiology) does not think it is related to his cardiac issues.  He is planning to do stress test if GI work-up is neg. ? ?No odynophagia or dysphagia.  No chest pains.  Has been taking Protonix. ? ?Never had EGD. ? ?Longstanding history of constipation for which she has been using herbal tea for years.  This is associated with abdominal bloating and generalized abdominal discomfort which does get better with BMs. ? ?He has not been drinking enough water. ? ?No recent weight loss.  Overall has good appetite. ? ?Past GI procedures ? ?CTA AP with contrast 11/2019: ?1. Stable mild fusiform aneurysmal dilatation of the infrarenal ?abdominal aorta measuring 3.4 cm in maximal diameter, grossly ?unchanged compared to 12/2016 ?2. Moderate amount of slightly irregular mixed calcified and ?noncalcified plaque results in approximately 50% luminal narrowing ?of the left common iliac artery. ?3. Bilateral nonobstructing nephrolithiasis. ?4. Suspected hepatic steatosis ?5. Moderate to large colonic stool burden without obstruction. ? ?-On review CTA 11/2019 does show 50% celiac narrowing, minimal SMA plaques 11/2019. ? ?12/2019  aorta 12/2021: 3.4 cm AAA ? ?Colonoscopy: 07/2015- Dr 08/2015.  Internal hemorrhoids.  Biopsies negative for microscopic colitis.  No further colonoscopies recommended due to age. ? ? ?Past Medical History:  ?Diagnosis Date  ? Abdominal aortic aneurysm (AAA), 30-34 mm diameter (HCC) 05/17/2015  ? Chronic constipation 11/01/2015  ? Coronary artery stenosis 05/16/2015  ? Cortical age-related cataract of both eyes 04/15/2018  ? Diarrhea 07/27/2018  ? Dyslipidemia 05/17/2015  ? Essential hypertension 05/17/2015  ? GAD (generalized anxiety disorder) 09/14/2019  ? Gastroesophageal reflux disease without esophagitis 10/25/2016  ? Glenohumeral arthritis 07/07/2014  ? History of colon polyps 11/01/2015  ? Hyperlipidemia   ? Hypertension   ? 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 Memory loss 05/03/2020  ? Other spondylosis with radiculopathy, lumbar region 05/10/2018  ? Peripheral vascular disease (HCC) 05/17/2015  ? Sacroiliac joint pain 05/10/2018  ? Senile nuclear sclerosis 04/15/2018  ? Formatting of this note might be different from the original. Added automatically from request for surgery [id number]  ? Sepsis (HCC) 10/25/2016  ? Spinal stenosis of lumbar region with neurogenic claudication 02/08/2018  ? Vitamin B12 deficiency 05/13/2019  ? Weakness 07/21/2017  ? ? ?Past Surgical History:  ?Procedure Laterality Date  ? SHOULDER SURGERY    ? ? ?Family History  ?Problem Relation Age of Onset  ? Colon cancer Father   ? Cancer Father   ? ? ?Social History  ? ?Tobacco Use  ? Smoking status: Former  ?  Passive exposure: Past  ? Smokeless tobacco: Never  ?Vaping Use  ? Vaping Use: Never  used  ?Substance Use Topics  ? Alcohol use: Yes  ? Drug use: No  ? ? ?Current Outpatient Medications  ?Medication Sig Dispense Refill  ? acetaminophen (TYLENOL) 500 MG tablet Take 500 mg by mouth as needed for moderate pain.    ? ALPRAZolam (XANAX) 0.25 MG tablet Take 1 tablet (0.25 mg total) by mouth as needed for anxiety. 14 tablet 0  ? amLODipine (NORVASC) 2.5 MG tablet  TAKE 1 TABLET(2.5 MG) BY MOUTH DAILY (Patient not taking: Reported on 02/06/2022) 90 tablet 1  ? aspirin EC 81 MG tablet Take 81 mg by mouth daily. (Patient not taking: Reported on 02/06/2022)    ? carvedilol (COREG) 3.125 MG tablet Take 1 tablet by mouth daily. Patient decrease from 1 bid to 1 qd (Patient not taking: Reported on 02/06/2022)    ? clopidogrel (PLAVIX) 75 MG tablet Take 75 mg by mouth daily.    ? DULoxetine (CYMBALTA) 20 MG capsule Take 20 mg by mouth daily. (Patient not taking: Reported on 02/06/2022)    ? lisinopril (ZESTRIL) 2.5 MG tablet TAKE 1 TABLET(2.5 MG) BY MOUTH DAILY (Patient not taking: Reported on 02/06/2022) 30 tablet 4  ? pantoprazole (PROTONIX) 40 MG tablet Take 40 mg by mouth daily. (Patient not taking: Reported on 02/06/2022)    ? pregabalin (LYRICA) 50 MG capsule Take 50 mg by mouth 2 (two) times daily. (Patient not taking: Reported on 02/06/2022)    ? rosuvastatin (CRESTOR) 40 MG tablet Take 40 mg by mouth daily. (Patient not taking: Reported on 02/06/2022)    ? vitamin B-12 (CYANOCOBALAMIN) 1000 MCG tablet Take 1,000 mcg by mouth daily. (Patient not taking: Reported on 02/06/2022)    ? VITAMIN D PO Take 1 tablet by mouth daily. Unknown strength (Patient not taking: Reported on 02/06/2022)    ? ?Current Facility-Administered Medications  ?Medication Dose Route Frequency Provider Last Rate Last Admin  ? 0.9 %  sodium chloride infusion  500 mL Intravenous Once Lynann BolognaGupta, Alexes Lamarque, MD      ? ? ?Allergies  ?Allergen Reactions  ? Penicillins   ? Ciprofloxacin   ?  Other reaction(s): Other (See Comments) ?UNKNOWN  ? Duloxetine   ?  PT REPORTS THAT HE FELT LIKE HE WAS GOING TO DIE AFTER HE TOOK THIS MED  ? Enalapril   ?  Other reaction(s): Other (See Comments) ?UNKNOWN  ? Metaxalone   ?  Other reaction(s): Other (See Comments) ?UNKNOWN  ? Niacin   ?  Other reaction(s): Other (See Comments) ?UNKNOWN  ? ? ?Review of Systems:  ?Constitutional: Denies fever, chills, diaphoresis, appetite change and has  fatigue.  ?HEENT: Denies photophobia, eye pain, redness, hearing loss, ear pain, congestion, sore throat, rhinorrhea, sneezing, mouth sores, neck pain, neck stiffness and tinnitus.   ?Respiratory: Denies SOB, DOE, cough, chest tightness,  and wheezing.   ?Cardiovascular: Denies chest pain, palpitations and leg swelling.  ?Genitourinary: Denies dysuria, urgency, frequency, hematuria, flank pain and difficulty urinating.  ?Musculoskeletal: has myalgias, back pain, joint swelling, arthralgias and gait problem.  ?Skin: No rash.  ?Neurological: Denies dizziness, seizures, syncope, weakness, light-headedness, numbness and headaches.  ?Hematological: Denies adenopathy. Easy bruising, personal or family bleeding history  ?Psychiatric/Behavioral: Has anxiety or depression ? ?  ? ?Physical Exam:   ? ?BP 135/66   Pulse 63   Temp 98.1 ?F (36.7 ?C) (Skin)   Ht 5\' 8"  (1.727 m)   Wt 172 lb (78 kg)   SpO2 97%   BMI 26.15 kg/m?  ?Wt Readings from  Last 3 Encounters:  ?02/06/22 172 lb (78 kg)  ?01/16/22 172 lb 2 oz (78.1 kg)  ?12/19/21 172 lb 1.3 oz (78.1 kg)  ? ?Constitutional:  Well-developed, in no acute distress. ?Psychiatric: Normal mood and affect. Behavior is normal. ?HEENT: Pupils normal.  Conjunctivae are normal. No scleral icterus.  ?Cardiovascular: Normal rate, regular rhythm. No edema ?Pulmonary/chest: Effort normal and breath sounds normal. No wheezing, rales or rhonchi. ?Abdominal: Soft, nondistended. Nontender. Bowel sounds active throughout. There are no masses palpable. No hepatomegaly. ?Rectal: Deferred ?Neurological: Alert and oriented to person place and time. ?Skin: Skin is warm and dry. No rashes noted. ? ?Data Reviewed: I have personally reviewed following labs and imaging studies ? ?CBC: ? ?  Latest Ref Rng & Units 01/16/2022  ?  9:56 AM 06/02/2017  ? 11:11 AM  ?CBC  ?WBC 4.0 - 10.5 K/uL 14.9   8.2    ?Hemoglobin 13.0 - 17.0 g/dL 44.3   15.4    ?Hematocrit 39.0 - 52.0 % 41.6   41.2    ?Platelets 150.0 -  400.0 K/uL 236.0   231    ? ? ?CMP: ? ?  Latest Ref Rng & Units 01/16/2022  ?  9:56 AM 12/22/2019  ? 10:19 AM 05/05/2019  ?  2:49 PM  ?CMP  ?Glucose 70 - 99 mg/dL 008   676   195    ?BUN 6 - 23 mg/dL 32   18   16    ?Creati

## 2022-02-06 NOTE — Patient Instructions (Signed)
Resume Plavix tomorrow ? ? ? ?YOU HAD AN ENDOSCOPIC PROCEDURE TODAY: Refer to the procedure report and other information in the discharge instructions given to you for any specific questions about what was found during the examination. If this information does not answer your questions, please call [place] office at [phone] to clarify.  ? ?YOU SHOULD EXPECT: Some feelings of bloating in the abdomen. Passage of more gas than usual. Walking can help get rid of the air that was put into your GI tract during the procedure and reduce the bloating. If you had a lower endoscopy (such as a colonoscopy or flexible sigmoidoscopy) you may notice spotting of blood in your stool or on the toilet paper. Some abdominal soreness may be present for a day or two, also. ? ?DIET: Your first meal following the procedure should be a light meal and then it is ok to progress to your normal diet. A half-sandwich or bowl of soup is an example of a good first meal. Heavy or fried foods are harder to digest and may make you feel nauseous or bloated. Drink plenty of fluids but you should avoid alcoholic beverages for 24 hours. If you had a esophageal dilation, please see attached instructions for diet.   ? ?ACTIVITY: Your care partner should take you home directly after the procedure. You should plan to take it easy, moving slowly for the rest of the day. You can resume normal activity the day after the procedure however YOU SHOULD NOT DRIVE, use power tools, machinery or perform tasks that involve climbing or major physical exertion for 24 hours (because of the sedation medicines used during the test).  ? ?SYMPTOMS TO REPORT IMMEDIATELY: ?A gastroenterologist can be reached at any hour. Please call [phone]  for any of the following symptoms:  ?Following upper endoscopy (EGD, EUS, ERCP, esophageal dilation) ?Vomiting of blood or coffee ground material  ?New, significant abdominal pain  ?New, significant chest pain or pain under the  shoulder blades  ?Painful or persistently difficult swallowing  ?New shortness of breath  ?Black, tarry-looking or red, bloody stools ? ?FOLLOW UP:  ?If any biopsies were taken you will be contacted by phone or by letter within the next 1-3 weeks. Call [phone]  if you have not heard about the biopsies in 3 weeks.  ?Please also call with any specific questions about appointments or follow up tests.  ?

## 2022-02-06 NOTE — Op Note (Signed)
[facility] ?Patient Name: Stanley Cain ?Procedure Date: 02/06/2022 9:19 AM ?MRN: [id number] ?Endoscopist: Lynann Bologna , MD ?Age: 88 ?Referring MD:  ?Date of Birth: [birth year] ?Gender: Male ?Account #: [id number] ?Procedure:                Upper GI endoscopy ?Indications:              Epigastric abdominal pain ?Medicines:                Monitored Anesthesia Care ?Procedure:                Pre-Anesthesia Assessment: ?                          - Prior to the procedure, a History and Physical  ?                          was performed, and patient medications and  ?                          allergies were reviewed. The patient's tolerance of  ?                          previous anesthesia was also reviewed. The risks  ?                          and benefits of the procedure and the sedation  ?                          options and risks were discussed with the patient.  ?                          All questions were answered, and informed consent  ?                          was obtained. Prior Anticoagulants: The patient has  ?                          taken Plavix last dose 5 days ago. ASA Grade  ?                          Assessment: II - A patient with mild systemic  ?                          disease. After reviewing the risks and benefits,  ?                          the patient was deemed in satisfactory condition to  ?                          undergo the procedure. ?                          After obtaining informed consent, the endoscope was  ?  passed under direct vision. Throughout the  ?                          procedure, the patient's blood pressure, pulse, and  ?                          oxygen saturations were monitored continuously. The  ?                          Endoscope was introduced through the mouth, and  ?                          advanced to the second part of duodenum. The upper  ?                          GI endoscopy was accomplished without difficulty.  ?                           The patient tolerated the procedure well. ?Scope In: ?Scope Out: ?Findings:                 The examined esophagus was mildly tortuous. ?                          LA Grade A (one or more mucosal breaks less than 5  ?                          mm, not extending between tops of 2 mucosal folds)  ?                          esophagitis with no bleeding was found 40 cm from  ?                          the incisors (at GE junction). Biopsies were taken  ?                          with a cold forceps for histology. ?                          Localized mild inflammation characterized by  ?                          erosions and erythema was found in the gastric  ?                          antrum. Biopsies were taken with a cold forceps for  ?                          histology. ?                          The examined duodenum was normal. Biopsies for  ?  histology were taken with a cold forceps for  ?                          evaluation of celiac disease. ?Complications:            No immediate complications. ?Estimated Blood Loss:     Estimated blood loss: none. ?Impression:               - Presbyesophagus. ?                          - LA Grade A reflux esophagitis with no bleeding.  ?                          Biopsied. ?                          - Gastritis. Biopsied. ?                          - Normal examined duodenum. Biopsied. ?Recommendation:           - Patient has a contact number available for  ?                          emergencies. The signs and symptoms of potential  ?                          delayed complications were discussed with the  ?                          patient. Return to normal activities tomorrow.  ?                          Written discharge instructions were provided to the  ?                          patient. ?                          - Resume previous diet. ?                          - Use Prilosec (omeprazole) 20 mg PO daily #90,  ?                           4RF. (Has not been using protonix) ?                          - Resume Plavix (clopidogrel) at prior dose  ?                          tomorrow. ?                          - Follow biopsies. ?                          -  The findings and recommendations were discussed  ?                          with the patient's family. Please print out latest  ?                          labs including serum cortisol level so that he can  ?                          discuss with Dr. Luiz Iron ?                          - FU if still with problems. ?Lynann Bologna, MD ?02/06/2022 9:46:13 AM ?This report has been signed electronically. ?

## 2022-02-06 NOTE — Progress Notes (Signed)
Called to room to assist during endoscopic procedure.  Patient ID and intended procedure confirmed with present staff. Received instructions for my participation in the procedure from the performing physician.  

## 2022-02-09 NOTE — Telephone Encounter (Signed)
The blood work was okay.  ?-Serum cortisol level was lower limit of normal ?-White cell count was elevated ? ?These may be nonspecific. Not sure what to make of that ?We have given copy of labs to the patient and patient's wife ?They need to make appointment with PCP ? ?We will follow biopsies from EGD ? ?RG ?

## 2022-02-10 ENCOUNTER — Telehealth: Payer: Self-pay

## 2022-02-10 NOTE — Telephone Encounter (Signed)
?  Follow up Call- ? ? ?  02/06/2022  ?  8:38 AM  ?Call back number  ?Post procedure Call Back phone  # [phone]  ?Permission to leave phone message Yes  ?  ? ?Patient questions: ? ?Do you have a fever, pain , or abdominal swelling? No. ?Pain Score  0 * ? ?Have you tolerated food without any problems? Yes.   ? ?Have you been able to return to your normal activities? Yes.   ? ?Do you have any questions about your discharge instructions: ?Diet   No. ?Medications  No. ?Follow up visit  No. ? ?Do you have questions or concerns about your Care? No. ? ?Actions: ?* If pain score is 4 or above: ?No action needed, pain <4. ? ?Have you developed a fever since your procedure? no ? ?2.   Have you had an respiratory symptoms (SOB or cough) since your procedure? no ? ?3.   Have you tested positive for COVID 19 since your procedure no ? ?4.   Have you had any family members/close contacts diagnosed with the COVID 19 since your procedure?  no ? ? ?If yes to any of these questions please route to Laverna Peace, RN and Karlton Lemon, RN  ? ? ?

## 2022-02-11 ENCOUNTER — Encounter: Payer: Self-pay | Admitting: Gastroenterology

## 2022-03-19 ENCOUNTER — Ambulatory Visit: Payer: Medicare Other | Admitting: Cardiology

## 2022-03-19 VITALS — BP 132/60 | HR 76 | Ht 69.0 in

## 2022-03-19 DIAGNOSIS — I10 Essential (primary) hypertension: Secondary | ICD-10-CM | POA: Diagnosis not present

## 2022-03-19 DIAGNOSIS — I714 Abdominal aortic aneurysm, without rupture, unspecified: Secondary | ICD-10-CM | POA: Diagnosis not present

## 2022-03-19 DIAGNOSIS — I2510 Atherosclerotic heart disease of native coronary artery without angina pectoris: Secondary | ICD-10-CM

## 2022-03-19 DIAGNOSIS — R072 Precordial pain: Secondary | ICD-10-CM

## 2022-03-19 DIAGNOSIS — I739 Peripheral vascular disease, unspecified: Secondary | ICD-10-CM

## 2022-03-19 NOTE — Patient Instructions (Addendum)
Medication Instructions:  Your physician recommends that you continue on your current medications as directed. Please refer to the Current Medication list given to you today.  *If you need a refill on your cardiac medications before your next appointment, please call your pharmacy*   Lab Work: None Ordered If you have labs (blood work) drawn today and your tests are completely normal, you will receive your results only by: MyChart Message (if you have MyChart) OR A paper copy in the mail If you have any lab test that is abnormal or we need to change your treatment, we will call you to review the results.   Testing/Procedures: Your physician has requested that you have a lexiscan myoview. For further information please visit [email or web address] Please follow instruction sheet, as given.  The test will take approximately 3 to 4 hours to complete; you may bring reading material.  If someone comes with you to your appointment, they will need to remain in the main lobby due to limited space in the testing area. **If you are pregnant or breastfeeding, please notify the nuclear lab prior to your appointment**  How to prepare for your Myocardial Perfusion Test: Do not eat or drink 3 hours prior to your test, except you may have water. Do not consume products containing caffeine (regular or decaffeinated) 12 hours prior to your test. (ex: coffee, chocolate, sodas, tea). Do bring a list of your current medications with you.  If not listed below, you may take your medications as normal. Do wear comfortable clothes (no dresses or overalls) and walking shoes, tennis shoes preferred (No heels or open toe shoes are allowed). Do NOT wear cologne, perfume, aftershave, or lotions (deodorant is allowed). If these instructions are not followed, your test will have to be rescheduled.     Follow-Up: At [facility], you and your health needs are our priority.  As part of our continuing mission to provide  you with exceptional heart care, we have created designated Provider Care Teams.  These Care Teams include your primary Cardiologist (physician) and Advanced Practice Providers (APPs -  Physician Assistants and Nurse Practitioners) who all work together to provide you with the care you need, when you need it.  We recommend signing up for the patient portal called "MyChart".  Sign up information is provided on this After Visit Summary.  MyChart is used to connect with patients for Virtual Visits (Telemedicine).  Patients are able to view lab/test results, encounter notes, upcoming appointments, etc.  Non-urgent messages can be sent to your provider as well.   To learn more about what you can do with MyChart, go to [email or web address].    Your next appointment:   5 month(s)  The format for your next appointment:   In Person  Provider:   Gypsy Balsam, MD    Other Instructions NA

## 2022-03-19 NOTE — Progress Notes (Signed)
Cardiology Office Note:    Date:  03/19/2022   ID:  Stanley Cain, DOB Jun 09, 1933, MRN [id number]  PCP:  Kristopher Glee., MD  Cardiologist:  Jenne Campus, MD    Referring MD: Kristopher Glee., MD   Chief Complaint  Patient presents with   Follow-up    History of Present Illness:    Stanley Cain is a 88 y.o. male with past medical history significant for coronary artery disease, status post PTCA and stenting done long time ago, essential hypertension, dyslipidemia, abdominal arctic aneurysm measuring less than 4 cm, he comes today to my office for follow-up like always there is a constellation of concerns and complaints.  The biggest one is weakness fatigue.  He also described to have shortness of breath while walking.  Last time we spoke we were talking about doing a stress test to make sure he has no reactivation of coronary artery disease however at that time he declined he wanted to wait until GI work-up will be performed.  It was done did not show any significant pathology therefore I asked him to have another stress test done.  He also seems to be a little depressed usually but today after admit he is unusually cheerful happy and jokes with me.  Past Medical History:  Diagnosis Date   Abdominal aortic aneurysm (AAA), 30-34 mm diameter (HCC) 05/17/2015   Chronic constipation 11/01/2015   Coronary artery stenosis 05/16/2015   Cortical age-related cataract of both eyes 04/15/2018   Diarrhea 07/27/2018   Dyslipidemia 05/17/2015   Essential hypertension 05/17/2015   GAD (generalized anxiety disorder) 09/14/2019   Gastroesophageal reflux disease without esophagitis 10/25/2016   Glenohumeral arthritis 07/07/2014   History of colon polyps 11/01/2015   Hyperlipidemia    Hypertension    Lymphocytic colitis 10/28/2011   Memory loss 05/03/2020   Other spondylosis with radiculopathy, lumbar region 05/10/2018   Peripheral vascular disease ([place]) 05/17/2015   Sacroiliac joint pain 05/10/2018    Senile nuclear sclerosis 04/15/2018   Formatting of this note might be different from the original. Added automatically from request for surgery [id number]   Sepsis ([place]) 10/25/2016   Spinal stenosis of lumbar region with neurogenic claudication 02/08/2018   Vitamin B12 deficiency 05/13/2019   Weakness 07/21/2017    Past Surgical History:  Procedure Laterality Date   SHOULDER SURGERY      Current Medications: Current Meds  Medication Sig   acetaminophen (TYLENOL) 500 MG tablet Take 500 mg by mouth as needed for moderate pain.   ALPRAZolam (XANAX) 0.25 MG tablet Take 1 tablet (0.25 mg total) by mouth as needed for anxiety.   amLODipine (NORVASC) 2.5 MG tablet TAKE 1 TABLET(2.5 MG) BY MOUTH DAILY (Patient taking differently: Take 2.5 mg by mouth daily.)   aspirin EC 81 MG tablet Take 81 mg by mouth daily.   carvedilol (COREG) 3.125 MG tablet Take 1 tablet by mouth daily. Patient decrease from 1 bid to 1 qd   clopidogrel (PLAVIX) 75 MG tablet Take 75 mg by mouth daily.   DULoxetine (CYMBALTA) 20 MG capsule Take 20 mg by mouth daily.   lisinopril (ZESTRIL) 2.5 MG tablet TAKE 1 TABLET(2.5 MG) BY MOUTH DAILY (Patient taking differently: Take 2.5 mg by mouth daily. TAKE 1 TABLET(2.5 MG) BY MOUTH DAILY)   omeprazole (PRILOSEC) 20 MG capsule Take 1 capsule (20 mg total) by mouth daily.   pregabalin (LYRICA) 50 MG capsule Take 50 mg by mouth 2 (two) times daily.   rosuvastatin (CRESTOR) 40 MG  tablet Take 40 mg by mouth daily.   vitamin B-12 (CYANOCOBALAMIN) 1000 MCG tablet Take 1,000 mcg by mouth daily.   VITAMIN D PO Take 1 tablet by mouth daily. Unknown strength     Allergies:   Penicillins, Ciprofloxacin, Duloxetine, Enalapril, Metaxalone, and Niacin   Social History   Socioeconomic History   Marital status: Married    Spouse name: Not on file   Number of children: Not on file   Years of education: Not on file   Highest education level: Not on file  Occupational History   Not on file   Tobacco Use   Smoking status: Former    Passive exposure: Past   Smokeless tobacco: Never  Vaping Use   Vaping Use: Never used  Substance and Sexual Activity   Alcohol use: Yes   Drug use: No   Sexual activity: Not on file  Other Topics Concern   Not on file  Social History Narrative   Not on file   Social Determinants of Health   Financial Resource Strain: Not on file  Food Insecurity: Not on file  Transportation Needs: Not on file  Physical Activity: Not on file  Stress: Not on file  Social Connections: Not on file     Family History: The patient's family history includes Cancer in his father; Colon cancer in his father. ROS:   Please see the history of present illness.    All 14 point review of systems negative except as described per history of present illness  EKGs/Labs/Other Studies Reviewed:      Recent Labs: 01/16/2022: ALT 18; BUN 32; Creatinine, Ser 1.01; Hemoglobin 13.9; Platelets 236.0; Potassium 4.8; Sodium 142; TSH 1.48  Recent Lipid Panel No results found for: CHOL, TRIG, HDL, CHOLHDL, VLDL, LDLCALC, LDLDIRECT  Physical Exam:    VS:  BP 132/60 (BP Location: Right Arm, Patient Position: Sitting)   Pulse 76   Ht 5\' 9"  (1.753 m)   SpO2 97%   BMI 25.40 kg/m     Wt Readings from Last 3 Encounters:  02/06/22 172 lb (78 kg)  01/16/22 172 lb 2 oz (78.1 kg)  12/19/21 172 lb 1.3 oz (78.1 kg)     GEN:  Well nourished, well developed in no acute distress HEENT: Normal NECK: No JVD; No carotid bruits LYMPHATICS: No lymphadenopathy CARDIAC: RRR, no murmurs, no rubs, no gallops RESPIRATORY:  Clear to auscultation without rales, wheezing or rhonchi  ABDOMEN: Soft, non-tender, non-distended MUSCULOSKELETAL:  No edema; No deformity  SKIN: Warm and dry LOWER EXTREMITIES: no swelling NEUROLOGIC:  Alert and oriented x 3 PSYCHIATRIC:  Normal affect   ASSESSMENT:    1. Precordial pain   2. Abdominal aortic aneurysm (AAA), 30-34 mm diameter (HCC)   3.  Coronary artery stenosis   4. Essential hypertension   5. Peripheral vascular disease ([place])   6. Primary hypertension    PLAN:    In order of problems listed above:  Atypical chest pain.  3 may be angina equivalent of profound fatigue tiredness, will do Lexiscan to make sure that he does not have reactivation of his coronary disease Abdominal arctic aneurysm I did review his ultrasounds done recently which showed no worsening of the condition Essential hypertension blood pressure well controlled continue present management PVD stable Dyslipidemia I did review his K PN LDL 65 HDL 40.  Continue present management   Overall again is a frustrating situation he has a constellation of symptoms every single time I see him I suspect  significant portion of his symptomatology is related to depression.  But I must make sure he does not have any organic heart disease including coronary artery disease  Medication Adjustments/Labs and Tests Ordered: Current medicines are reviewed at length with the patient today.  Concerns regarding medicines are outlined above.  Orders Placed This Encounter  Procedures   MYOCARDIAL PERFUSION IMAGING   EKG 12-Lead   Medication changes: No orders of the defined types were placed in this encounter.   Signed, Park Liter, MD, [facility] 03/19/2022 4:00 PM    [place]

## 2022-03-20 ENCOUNTER — Telehealth (HOSPITAL_COMMUNITY): Payer: Self-pay | Admitting: Radiology

## 2022-03-20 NOTE — Telephone Encounter (Signed)
Patient given detailed instructions per Myocardial Perfusion Study Information Sheet for the test on 03/26/2022 at 10:00. Patient notified to arrive 15 minutes early and that it is imperative to arrive on time for appointment to keep from having the test rescheduled.  If you need to cancel or reschedule your appointment, please call the office within 24 hours of your appointment. . Patient verbalized understanding.EHK

## 2022-03-26 ENCOUNTER — Encounter (HOSPITAL_COMMUNITY): Payer: Medicare Other

## 2022-03-26 ENCOUNTER — Ambulatory Visit (HOSPITAL_COMMUNITY): Payer: Medicare Other | Attending: Cardiovascular Disease

## 2022-03-26 DIAGNOSIS — R072 Precordial pain: Secondary | ICD-10-CM

## 2022-03-26 LAB — MYOCARDIAL PERFUSION IMAGING
LV dias vol: 83 mL (ref 62–150)
LV sys vol: 26 mL
Nuc Stress EF: 69 %
Peak HR: 76 {beats}/min
Rest HR: 62 {beats}/min
Rest Nuclear Isotope Dose: 10.1 mCi
SDS: 0
SRS: 0
SSS: 0
ST Depression (mm): 0 mm
Stress Nuclear Isotope Dose: 32.2 mCi
TID: 0.94

## 2022-03-26 MED ORDER — REGADENOSON 0.4 MG/5ML IV SOLN
0.4000 mg | Freq: Once | INTRAVENOUS | Status: AC
  Administered 2022-03-26: 0.4 mg via INTRAVENOUS

## 2022-03-26 MED ORDER — TECHNETIUM TC 99M TETROFOSMIN IV KIT
32.2000 | PACK | Freq: Once | INTRAVENOUS | Status: AC | PRN
  Administered 2022-03-26: 32.2 via INTRAVENOUS

## 2022-03-26 MED ORDER — TECHNETIUM TC 99M TETROFOSMIN IV KIT
10.1000 | PACK | Freq: Once | INTRAVENOUS | Status: AC | PRN
  Administered 2022-03-26: 10.1 via INTRAVENOUS

## 2022-04-03 ENCOUNTER — Telehealth: Payer: Self-pay

## 2022-04-03 NOTE — Telephone Encounter (Signed)
Results reviewed with pt's spouse-per DPR- as per Dr. Krasowski's note.  Pt's spouse verbalized understanding and had no additional questions. Routed to PCP  

## 2022-06-06 ENCOUNTER — Other Ambulatory Visit: Payer: Self-pay | Admitting: Cardiology

## 2022-10-28 ENCOUNTER — Other Ambulatory Visit: Payer: Self-pay

## 2022-10-29 ENCOUNTER — Ambulatory Visit: Payer: Medicare Other | Attending: Cardiology | Admitting: Cardiology

## 2022-10-29 ENCOUNTER — Encounter: Payer: Self-pay | Admitting: Cardiology

## 2022-10-29 ENCOUNTER — Ambulatory Visit: Payer: Medicare Other | Admitting: Cardiology

## 2022-10-29 VITALS — BP 120/70 | HR 71 | Ht 67.5 in | Wt 172.1 lb

## 2022-10-29 DIAGNOSIS — I2510 Atherosclerotic heart disease of native coronary artery without angina pectoris: Secondary | ICD-10-CM | POA: Diagnosis not present

## 2022-10-29 DIAGNOSIS — I714 Abdominal aortic aneurysm, without rupture, unspecified: Secondary | ICD-10-CM | POA: Diagnosis not present

## 2022-10-29 DIAGNOSIS — E785 Hyperlipidemia, unspecified: Secondary | ICD-10-CM

## 2022-10-29 DIAGNOSIS — I10 Essential (primary) hypertension: Secondary | ICD-10-CM | POA: Diagnosis not present

## 2022-10-29 NOTE — Patient Instructions (Signed)
Medication Instructions:  Your physician recommends that you continue on your current medications as directed. Please refer to the Current Medication list given to you today.  *If you need a refill on your cardiac medications before your next appointment, please call your pharmacy*   Lab Work: None Ordered If you have labs (blood work) drawn today and your tests are completely normal, you will receive your results only by: MyChart Message (if you have MyChart) OR A paper copy in the mail If you have any lab test that is abnormal or we need to change your treatment, we will call you to review the results.   Testing/Procedures: None Ordered   Follow-Up: At [facility], you and your health needs are our priority.  As part of our continuing mission to provide you with exceptional heart care, we have created designated Provider Care Teams.  These Care Teams include your primary Cardiologist (physician) and Advanced Practice Providers (APPs -  Physician Assistants and Nurse Practitioners) who all work together to provide you with the care you need, when you need it.  We recommend signing up for the patient portal called "MyChart".  Sign up information is provided on this After Visit Summary.  MyChart is used to connect with patients for Virtual Visits (Telemedicine).  Patients are able to view lab/test results, encounter notes, upcoming appointments, etc.  Non-urgent messages can be sent to your provider as well.   To learn more about what you can do with MyChart, go to [email or web address].    Your next appointment:   4 month(s)  The format for your next appointment:   In Person  Provider:   Robert Krasowski, MD    Other Instructions NA  

## 2022-10-29 NOTE — Progress Notes (Signed)
Cardiology Office Note:    Date:  10/29/2022   ID:  Nina Hoar, DOB [birth year], MRN [id number]  PCP:  Andreas Blower., MD  Cardiologist:  Gypsy Balsam, MD    Referring MD: Andreas Blower., MD   Chief Complaint  Patient presents with   Follow-up  Still complaining of being weak and tired  History of Present Illness:    Stanley Cain is a 89 y.o. male  with past medical history significant for coronary artery disease, status post PTCA and stenting done long time ago, essential hypertension, dyslipidemia, abdominal arctic aneurysm measuring less than 4 cm, he comes today to my office for follow-up like always there is a constellation of concerns and complaints.  Like always complaining of having weakness fatigue tiredness he is upset overall that he is feeling so bad.  However he got multiple workup done and honestly we cannot identify any neurological explanation for his symptomatology.  He is being started on Zoloft however he was afraid to take this medication I encouraged him to take it I suspect significant portion of his symptomatology is related to depression.  And I guess my assessment based on the fact that year's ago he went to the beach with his friends and according to his wife he was doing everything he was walking and driving himself having some beer really having good time and no complaints but when he is home there is a lot of complaints.  Past Medical History:  Diagnosis Date   Abdominal aortic aneurysm (AAA), 30-34 mm diameter (HCC) 05/17/2015   Chronic constipation 11/01/2015   Coronary artery stenosis 05/16/2015   Cortical age-related cataract of both eyes 04/15/2018   Diarrhea 07/27/2018   Dyslipidemia 05/17/2015   Essential hypertension 05/17/2015   GAD (generalized anxiety disorder) 09/14/2019   Gastroesophageal reflux disease without esophagitis 10/25/2016   Glenohumeral arthritis 07/07/2014   History of colon polyps 11/01/2015   Hyperlipidemia    Hypertension     Lymphocytic colitis 10/28/2011   Memory loss 05/03/2020   Other spondylosis with radiculopathy, lumbar region 05/10/2018   Peripheral vascular disease (HCC) 05/17/2015   Sacroiliac joint pain 05/10/2018   Senile nuclear sclerosis 04/15/2018   Formatting of this note might be different from the original. 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   ALPRAZolam (XANAX) 0.25 MG tablet Take 1 tablet (0.25 mg total) by mouth as needed for anxiety.   amLODipine (NORVASC) 2.5 MG tablet Take 1 tablet (2.5 mg total) by mouth daily.   aspirin EC 81 MG tablet Take 81 mg by mouth daily.   carvedilol (COREG) 3.125 MG tablet Take 1 tablet by mouth daily. Patient decrease from 1 bid to 1 qd   clopidogrel (PLAVIX) 75 MG tablet Take 75 mg by mouth daily.   DULoxetine (CYMBALTA) 20 MG capsule Take 20 mg by mouth daily.   lisinopril (ZESTRIL) 2.5 MG tablet TAKE 1 TABLET(2.5 MG) BY MOUTH DAILY (Patient taking differently: Take 2.5 mg by mouth daily. TAKE 1 TABLET(2.5 MG) BY MOUTH DAILY)   omeprazole (PRILOSEC) 20 MG capsule Take 1 capsule (20 mg total)  by mouth daily.   pregabalin (LYRICA) 50 MG capsule Take 50 mg by mouth 2 (two) times daily.   rosuvastatin (CRESTOR) 40 MG tablet Take 40 mg by mouth daily.   vitamin B-12 (CYANOCOBALAMIN) 1000 MCG tablet Take 1,000 mcg by mouth daily.   VITAMIN D PO Take 1 tablet by mouth daily. Unknown strength     Allergies:   Penicillins, Ciprofloxacin, Duloxetine, Enalapril, Metaxalone, and Niacin   Social History   Socioeconomic History   Marital status: Married    Spouse name: Not on  file   Number of children: Not on file   Years of education: Not on file   Highest education level: Not on file  Occupational History   Not on file  Tobacco Use   Smoking status: Former    Passive exposure: Past   Smokeless tobacco: Never  Vaping Use   Vaping Use: Never used  Substance and Sexual Activity   Alcohol use: Yes   Drug use: No   Sexual activity: Not on file  Other Topics Concern   Not on file  Social History Narrative   Not on file   Social Determinants of Health   Financial Resource Strain: Not on file  Food Insecurity: Not on file  Transportation Needs: Not on file  Physical Activity: Not on file  Stress: Not on file  Social Connections: Not on file     Family History: The patient's family history includes Cancer in his father; Colon cancer in his father. ROS:   Please see the history of present illness.    All 14 point review of systems negative except as described per history of present illness  EKGs/Labs/Other Studies Reviewed:      Recent Labs: 01/16/2022: ALT 18; BUN 32; Creatinine, Ser 1.01; Hemoglobin 13.9; Platelets 236.0; Potassium 4.8; Sodium 142; TSH 1.48  Recent Lipid Panel No results found for: "CHOL", "TRIG", "HDL", "CHOLHDL", "VLDL", "LDLCALC", "LDLDIRECT"  Physical Exam:    VS:  BP 120/70 (BP Location: Left Arm, Patient Position: Sitting)   Pulse 71   Ht 5' 7.5" (1.715 m)   Wt 172 lb 1.3 oz (78.1 kg)   SpO2 95%   BMI 26.55 kg/m     Wt Readings from Last 3 Encounters:  10/29/22 172 lb 1.3 oz (78.1 kg)  02/06/22 172 lb (78 kg)  01/16/22 172 lb 2 oz (78.1 kg)     GEN:  Well nourished, well developed in no acute distress HEENT: Normal NECK: No JVD; No carotid bruits LYMPHATICS: No lymphadenopathy CARDIAC: RRR, no murmurs, no rubs, no gallops RESPIRATORY:  Clear to auscultation without rales, wheezing or rhonchi  ABDOMEN: Soft, non-tender, non-distended MUSCULOSKELETAL:  No edema; No deformity  SKIN: Warm and dry LOWER  EXTREMITIES: no swelling NEUROLOGIC:  Alert and oriented x 3 PSYCHIATRIC:  Normal affect   ASSESSMENT:    1. Abdominal aortic aneurysm (AAA), 30-34 mm diameter (HCC)   2. Coronary artery stenosis   3. Essential hypertension   4. Dyslipidemia    PLAN:    In order of problems listed above:  Coronary disease appears to be stable from that point review and continue present management. Abdominal trochanter was made only small diameter.  Next year will repeat ultrasound. Essential hypertension blood pressure well-controlled we will continue present management. Dyslipidemia on Crestor 40 which I will continue, his lipid profile from 4 months ago showing LDL 55 HDL 40 we will continue present management   Medication Adjustments/Labs and Tests Ordered: Current medicines are reviewed  at length with the patient today.  Concerns regarding medicines are outlined above.  No orders of the defined types were placed in this encounter.  Medication changes: No orders of the defined types were placed in this encounter.   Signed, Park Liter, MD, [facility] 10/29/2022 4:50 PM    [place]

## 2022-11-12 ENCOUNTER — Ambulatory Visit: Payer: Medicare Other | Admitting: Cardiology

## 2023-03-02 ENCOUNTER — Ambulatory Visit: Payer: Medicare Other | Attending: Cardiology | Admitting: Cardiology

## 2023-03-02 ENCOUNTER — Encounter: Payer: Self-pay | Admitting: Cardiology

## 2023-03-02 VITALS — BP 130/68 | HR 68 | Ht 68.0 in | Wt 167.0 lb

## 2023-03-02 DIAGNOSIS — E1151 Type 2 diabetes mellitus with diabetic peripheral angiopathy without gangrene: Secondary | ICD-10-CM | POA: Insufficient documentation

## 2023-03-02 DIAGNOSIS — I714 Abdominal aortic aneurysm, without rupture, unspecified: Secondary | ICD-10-CM | POA: Diagnosis not present

## 2023-03-02 DIAGNOSIS — I10 Essential (primary) hypertension: Secondary | ICD-10-CM | POA: Diagnosis not present

## 2023-03-02 DIAGNOSIS — R0609 Other forms of dyspnea: Secondary | ICD-10-CM

## 2023-03-02 DIAGNOSIS — H52203 Unspecified astigmatism, bilateral: Secondary | ICD-10-CM | POA: Insufficient documentation

## 2023-03-02 DIAGNOSIS — R531 Weakness: Secondary | ICD-10-CM

## 2023-03-02 DIAGNOSIS — M1A00X0 Idiopathic chronic gout, unspecified site, without tophus (tophi): Secondary | ICD-10-CM | POA: Insufficient documentation

## 2023-03-02 DIAGNOSIS — I2510 Atherosclerotic heart disease of native coronary artery without angina pectoris: Secondary | ICD-10-CM | POA: Diagnosis not present

## 2023-03-02 DIAGNOSIS — Z1509 Genetic susceptibility to other malignant neoplasm: Secondary | ICD-10-CM | POA: Insufficient documentation

## 2023-03-02 DIAGNOSIS — M4727 Other spondylosis with radiculopathy, lumbosacral region: Secondary | ICD-10-CM | POA: Insufficient documentation

## 2023-03-02 DIAGNOSIS — M19019 Primary osteoarthritis, unspecified shoulder: Secondary | ICD-10-CM | POA: Insufficient documentation

## 2023-03-02 DIAGNOSIS — Z7901 Long term (current) use of anticoagulants: Secondary | ICD-10-CM | POA: Insufficient documentation

## 2023-03-02 DIAGNOSIS — F339 Major depressive disorder, recurrent, unspecified: Secondary | ICD-10-CM | POA: Insufficient documentation

## 2023-03-02 DIAGNOSIS — K5909 Other constipation: Secondary | ICD-10-CM

## 2023-03-02 DIAGNOSIS — E785 Hyperlipidemia, unspecified: Secondary | ICD-10-CM

## 2023-03-02 NOTE — Progress Notes (Signed)
Cardiology Office Note:    Date:  03/02/2023   ID:  Stanley Cain, DOB Nov 14, 1932, MRN [id number]  PCP:  Andreas Blower., MD  Cardiologist:  Gypsy Balsam, MD    Referring MD: Andreas Blower., MD   Chief Complaint  Patient presents with   Fatigue    History of Present Illness:    Stanley Cain is a 89 y.o. male past medical history significant for coronary artery disease status post PTCA and stenting done long time ago, last evaluation of coronary artery was done in May 2023 in form of stress test which was negative, abdominal arctic aneurysm measuring less than 4 cm, essential hypertension, dyslipidemia.  Comes today to my office like always with his wife.  He is complaining like always of being weak tired and exhausted.  He said he gets up in the morning he forces himself to go and do some work when he comes back after a walk he have to lay down for about 20 minutes before he started feeling better denies have any chest pain tightness squeezing pressure burning chest while walking.  Then afternoon and evening, usually better.  Recently he end up going to the emergency room because of abdominal pain.  CT was done and he was found to have diverticulosis.  Antibiotic has been given he recovered doing well  Past Medical History:  Diagnosis Date   Abdominal aortic aneurysm (AAA), 30-34 mm diameter (HCC) 05/17/2015   Chronic constipation 11/01/2015   Coronary artery stenosis 05/16/2015   Cortical age-related cataract of both eyes 04/15/2018   Diarrhea 07/27/2018   Dyslipidemia 05/17/2015   Essential hypertension 05/17/2015   GAD (generalized anxiety disorder) 09/14/2019   Gastroesophageal reflux disease without esophagitis 10/25/2016   Glenohumeral arthritis 07/07/2014   History of colon polyps 11/01/2015   Hyperlipidemia    Hypertension    Lymphocytic colitis 10/28/2011   Memory loss 05/03/2020   Other spondylosis with radiculopathy, lumbar region 05/10/2018   Peripheral vascular disease  (HCC) 05/17/2015   Sacroiliac joint pain 05/10/2018   Senile nuclear sclerosis 04/15/2018   Formatting of this note might be different from the original. Added automatically from request for surgery [id number]   Sepsis (HCC) 10/25/2016   Spinal stenosis of lumbar region with neurogenic claudication 02/08/2018   Vitamin B12 deficiency 05/13/2019   Weakness 07/21/2017    Past Surgical History:  Procedure Laterality Date   SHOULDER SURGERY      Current Medications: Current Meds  Medication Sig   acetaminophen (TYLENOL) 500 MG tablet Take 500 mg by mouth as needed for moderate pain.   ALPRAZolam (XANAX) 0.25 MG tablet Take 1 tablet (0.25 mg total) by mouth as needed for anxiety.   amLODipine (NORVASC) 2.5 MG tablet Take 1 tablet (2.5 mg total) by mouth daily.   aspirin EC 81 MG tablet Take 81 mg by mouth daily.   carvedilol (COREG) 3.125 MG tablet Take 1 tablet by mouth daily. Patient decrease from 1 bid to 1 qd   clopidogrel (PLAVIX) 75 MG tablet Take 75 mg by mouth daily.   DULoxetine (CYMBALTA) 20 MG capsule Take 20 mg by mouth daily.   lisinopril (ZESTRIL) 2.5 MG tablet TAKE 1 TABLET(2.5 MG) BY MOUTH DAILY (Patient taking differently: Take 2.5 mg by mouth daily. TAKE 1 TABLET(2.5 MG) BY MOUTH DAILY)   omeprazole (PRILOSEC) 20 MG capsule Take 1 capsule (20 mg total) by mouth daily.   pregabalin (LYRICA) 50 MG capsule Take 50 mg by mouth 2 (two) times daily.  rosuvastatin (CRESTOR) 40 MG tablet Take 40 mg by mouth daily.   vitamin B-12 (CYANOCOBALAMIN) 1000 MCG tablet Take 1,000 mcg by mouth daily.   VITAMIN D PO Take 1 tablet by mouth daily. Unknown strength     Allergies:   Penicillins, Ciprofloxacin, Duloxetine, Enalapril, Metaxalone, and Niacin   Social History   Socioeconomic History   Marital status: Married    Spouse name: Not on file   Number of children: Not on file   Years of education: Not on file   Highest education level: Not on file  Occupational History   Not on file   Tobacco Use   Smoking status: Former    Passive exposure: Past   Smokeless tobacco: Never  Vaping Use   Vaping Use: Never used  Substance and Sexual Activity   Alcohol use: Yes   Drug use: No   Sexual activity: Not on file  Other Topics Concern   Not on file  Social History Narrative   Not on file   Social Determinants of Health   Financial Resource Strain: Not on file  Food Insecurity: Not on file  Transportation Needs: Not on file  Physical Activity: Not on file  Stress: Not on file  Social Connections: Not on file     Family History: The patient's family history includes Cancer in his father; Colon cancer in his father. ROS:   Please see the history of present illness.    All 14 point review of systems negative except as described per history of present illness  EKGs/Labs/Other Studies Reviewed:      Recent Labs: No results found for requested labs within last 365 days.  Recent Lipid Panel No results found for: "CHOL", "TRIG", "HDL", "CHOLHDL", "VLDL", "LDLCALC", "LDLDIRECT"  Physical Exam:    VS:  BP 130/68 (BP Location: Left Arm, Patient Position: Sitting)   Pulse 68   Ht 5\' 8"  (1.727 m)   Wt 167 lb (75.8 kg)   SpO2 93%   BMI 25.39 kg/m     Wt Readings from Last 3 Encounters:  03/02/23 167 lb (75.8 kg)  10/29/22 172 lb 1.3 oz (78.1 kg)  02/06/22 172 lb (78 kg)     GEN:  Well nourished, well developed in no acute distress HEENT: Normal NECK: No JVD; No carotid bruits LYMPHATICS: No lymphadenopathy CARDIAC: RRR, no murmurs, no rubs, no gallops RESPIRATORY:  Clear to auscultation without rales, wheezing or rhonchi  ABDOMEN: Soft, non-tender, non-distended MUSCULOSKELETAL:  No edema; No deformity  SKIN: Warm and dry LOWER EXTREMITIES: no swelling NEUROLOGIC:  Alert and oriented x 3 PSYCHIATRIC:  Normal affect   ASSESSMENT:    1. Abdominal aortic aneurysm (AAA), 30-34 mm diameter (HCC)   2. Coronary artery stenosis   3. Essential hypertension    4. Type 2 diabetes mellitus with peripheral vascular disease (HCC)   5. Chronic constipation   6. Dyslipidemia   7. Weakness    PLAN:    In order of problems listed above:  Coronary artery disease stable from that point review and appropriate medications will continue present management. Abdominal arctic aneurysm.  Size unchanged.  No need to intervene. Essential hypertension blood pressure well-controlled continue present management. Dyslipidemia, I did review blood test done 9 months ago which show LDL of 55 HDL 40.  Will continue present management. Diverticulosis.  Likely he recovered after antibiotic doing well. Weakness chronic complaint will repeat echocardiogram in show ejection fraction is preserved.   Medication Adjustments/Labs and Tests Ordered: Current  medicines are reviewed at length with the patient today.  Concerns regarding medicines are outlined above.  No orders of the defined types were placed in this encounter.  Medication changes: No orders of the defined types were placed in this encounter.   Signed, Georgeanna Lea, MD, [facility] 03/02/2023 2:13 PM    [facility]

## 2023-03-02 NOTE — Patient Instructions (Signed)

## 2023-03-09 ENCOUNTER — Other Ambulatory Visit: Payer: Self-pay | Admitting: Cardiology

## 2023-03-09 NOTE — Telephone Encounter (Signed)
Refill sent to pharmacy.   

## 2023-03-11 ENCOUNTER — Other Ambulatory Visit: Payer: Self-pay | Admitting: Gastroenterology

## 2023-06-18 ENCOUNTER — Ambulatory Visit (HOSPITAL_BASED_OUTPATIENT_CLINIC_OR_DEPARTMENT_OTHER)
Admission: RE | Admit: 2023-06-18 | Discharge: 2023-06-18 | Disposition: A | Discharge status: Home / Self Care | Payer: Medicare Other | Source: Ambulatory Visit | Attending: Cardiology | Admitting: Cardiology

## 2023-06-18 DIAGNOSIS — R531 Weakness: Secondary | ICD-10-CM | POA: Diagnosis not present

## 2023-06-18 DIAGNOSIS — R0609 Other forms of dyspnea: Secondary | ICD-10-CM

## 2023-06-18 LAB — ECHOCARDIOGRAM COMPLETE
Area-P 1/2: 3.34 cm2
S' Lateral: 2.8 cm

## 2023-08-01 IMAGING — US US AORTA
1 series · 14 of 14 positions shown · non-contrast
Comparison: CT 12/23/2019, 12/11/2020

CLINICAL DATA: 89-year-old male with a history of abdominal
aneurysm

EXAM:
ULTRASOUND OF ABDOMINAL AORTA
TECHNIQUE: Ultrasound examination of the abdominal aorta was performed to
evaluate for abdominal aortic aneurysm.

[Series 1: us aorta · 14 of 14 slices shown]
[im 1/14]
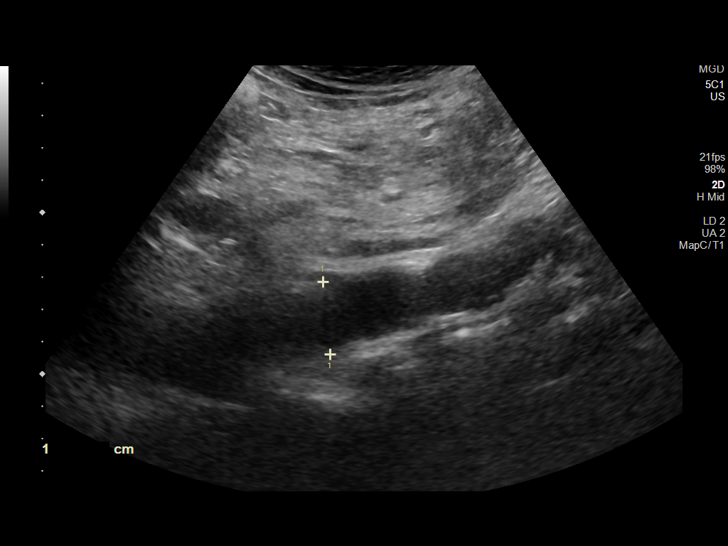
[im 2/14]
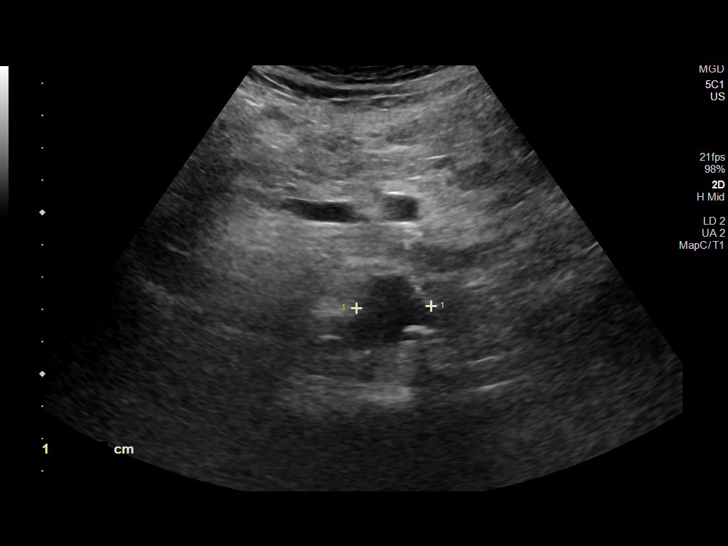
[im 3/14]
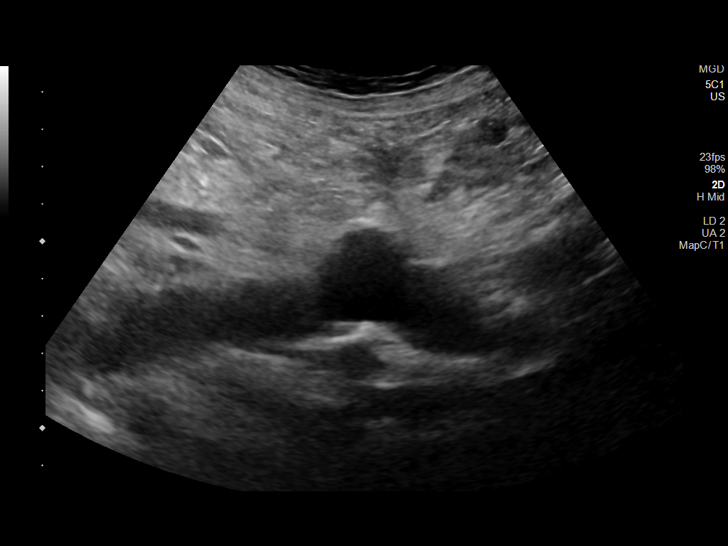
[im 4/14]
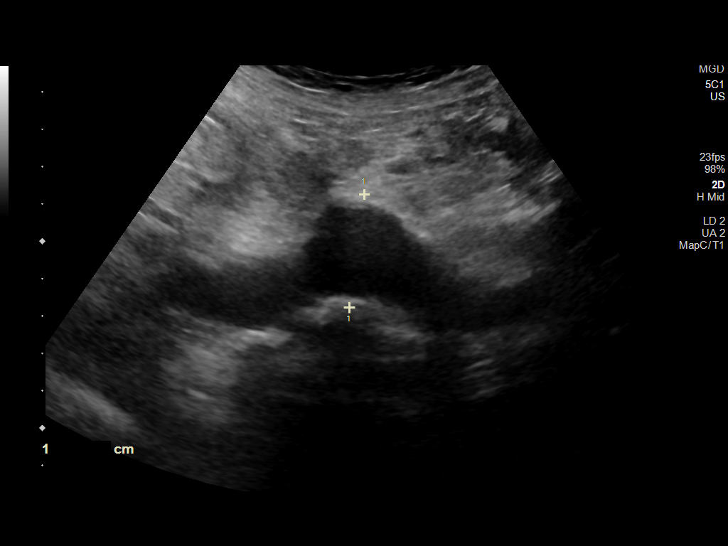
[im 5/14]
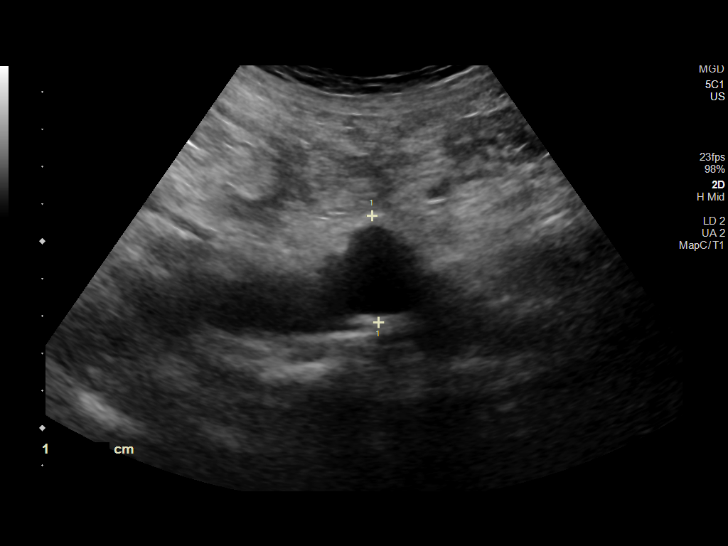
[im 6/14]
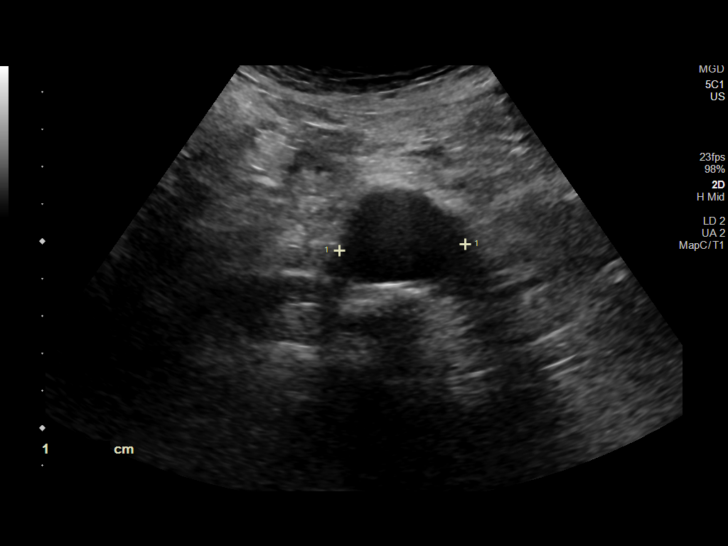
[im 7/14]
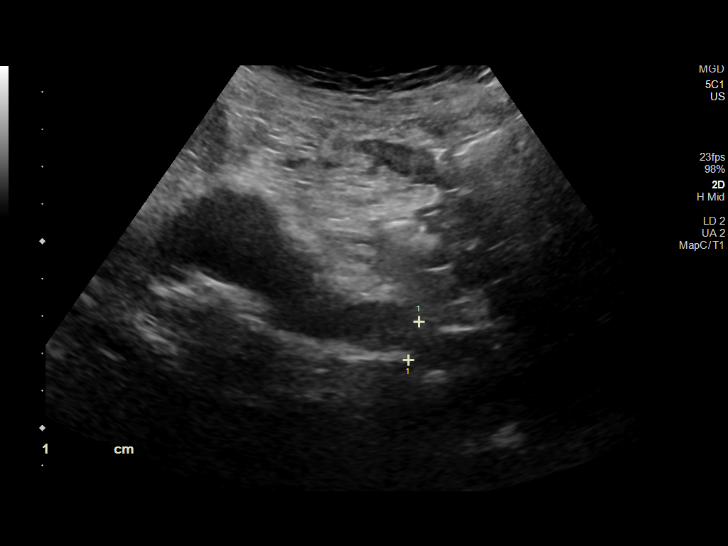
[im 8/14]
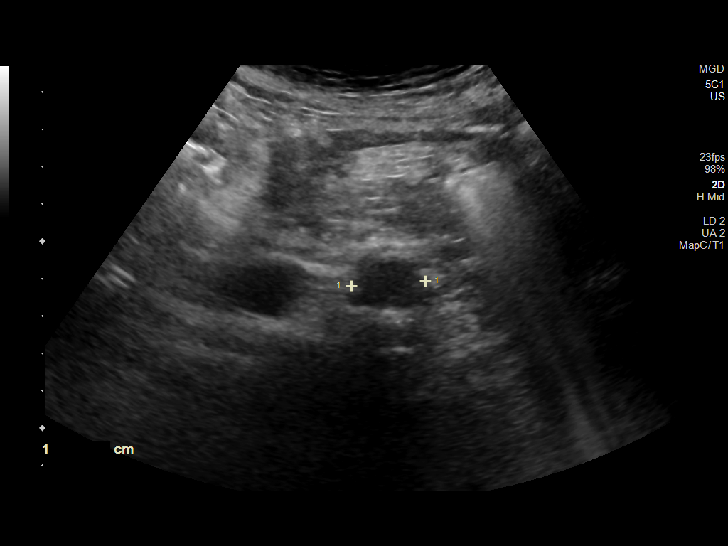
[im 9/14]
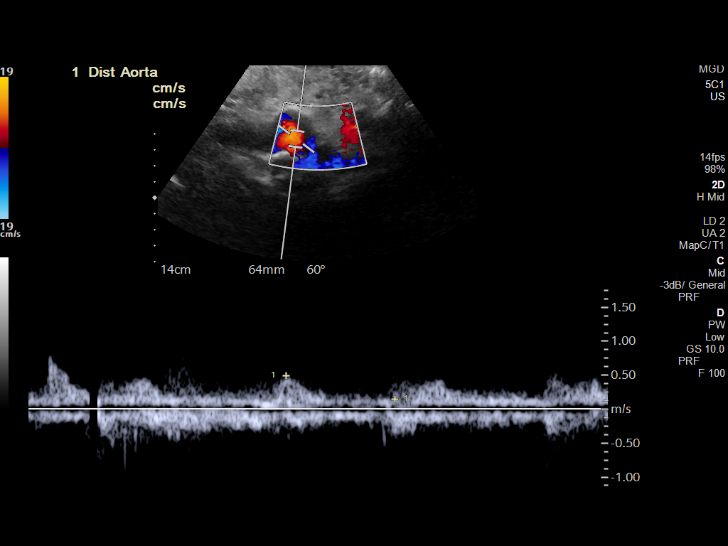
[im 10/14]
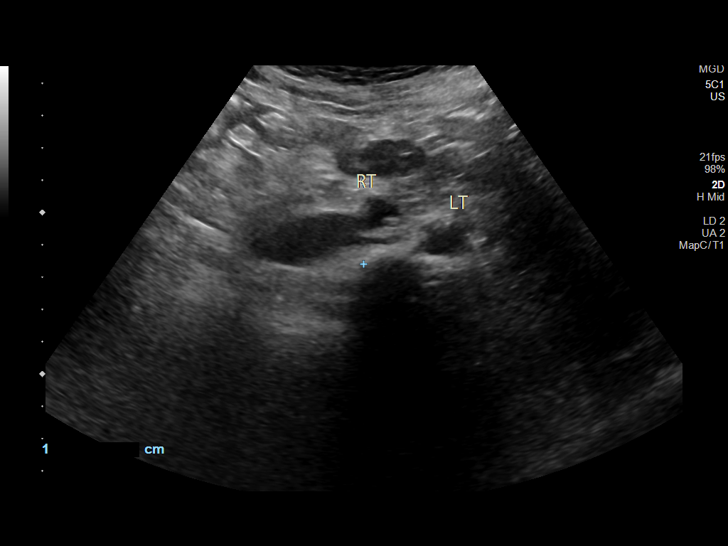
[im 11/14]
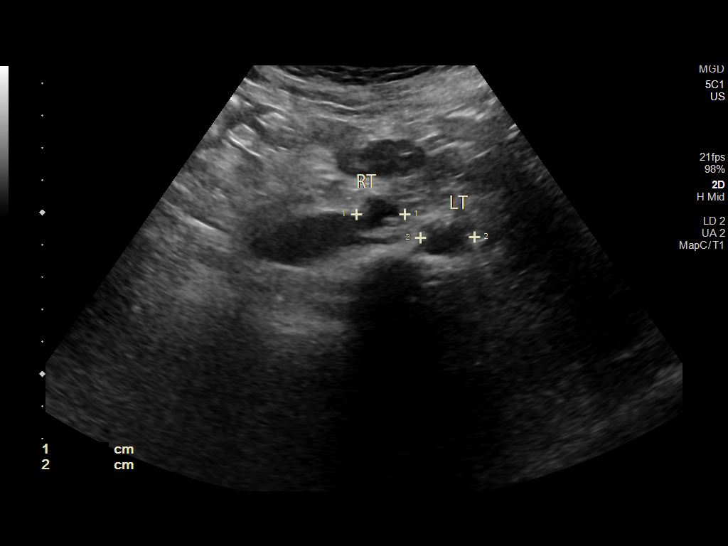
[im 12/14]
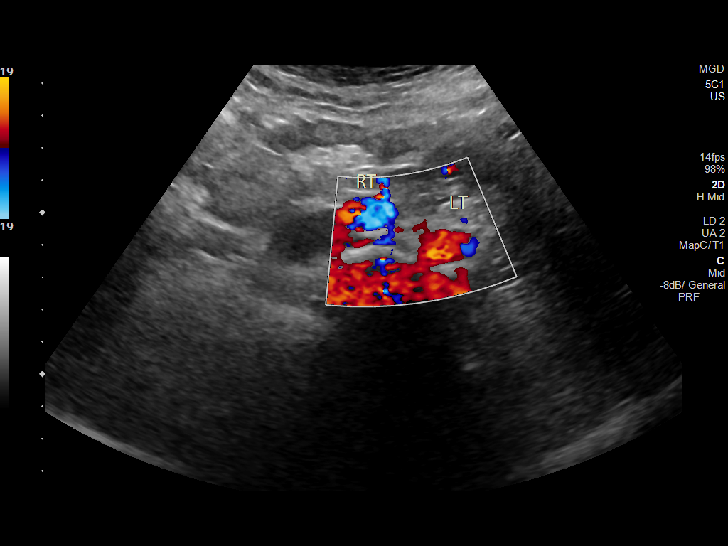
[im 13/14]
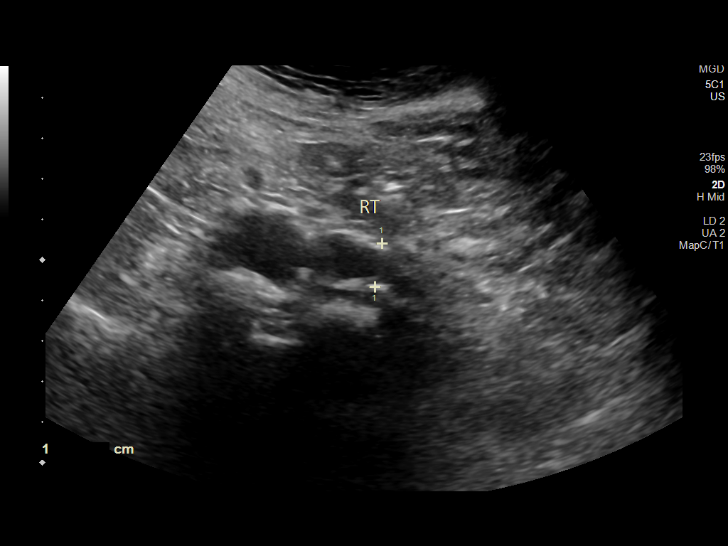
[im 14/14]
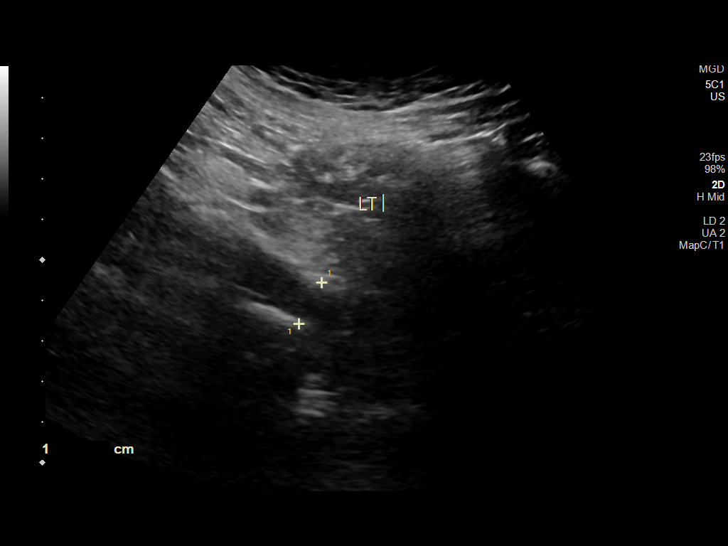

[14 of 14 positions shown; findings below may reference images not displayed]

FINDINGS: Abdominal aortic measurements as follows:

Proximal:  2.3 cm

Mid:  3.4 cm

Distal:  2.0 cm

Right iliac artery: 1.5 cm

Left iliac artery: 1.6 cm
IMPRESSION: Recommend follow-up every 3 years. This recommendation follows ACR
consensus guidelines: White Paper of the ACR Incidental Findings
Committee II on Vascular Findings. [HOSPITAL] 4043;

## 2023-11-27 ENCOUNTER — Ambulatory Visit: Payer: Medicare Other | Attending: Cardiology | Admitting: Cardiology

## 2023-11-27 ENCOUNTER — Encounter: Payer: Self-pay | Admitting: Cardiology

## 2023-11-27 VITALS — BP 120/74 | HR 85 | Ht 67.0 in | Wt 166.0 lb

## 2023-11-27 DIAGNOSIS — I714 Abdominal aortic aneurysm, without rupture, unspecified: Secondary | ICD-10-CM | POA: Diagnosis not present

## 2023-11-27 DIAGNOSIS — I2510 Atherosclerotic heart disease of native coronary artery without angina pectoris: Secondary | ICD-10-CM

## 2023-11-27 DIAGNOSIS — R531 Weakness: Secondary | ICD-10-CM | POA: Diagnosis not present

## 2023-11-27 DIAGNOSIS — R0609 Other forms of dyspnea: Secondary | ICD-10-CM | POA: Diagnosis not present

## 2023-11-27 DIAGNOSIS — E782 Mixed hyperlipidemia: Secondary | ICD-10-CM

## 2023-11-27 DIAGNOSIS — E1151 Type 2 diabetes mellitus with diabetic peripheral angiopathy without gangrene: Secondary | ICD-10-CM

## 2023-11-27 DIAGNOSIS — I10 Essential (primary) hypertension: Secondary | ICD-10-CM

## 2023-11-27 NOTE — Progress Notes (Unsigned)
Cardiology Office Note:    Date:  11/27/2023   ID:  Stanley Cain, DOB July 07, 1933, MRN [id number]  PCP:  Andreas Blower., MD  Cardiologist:  Gypsy Balsam, MD    Referring MD: Andreas Blower., MD   Chief Complaint  Patient presents with   Medication Management    History of Present Illness:    Stanley Cain is a 89 y.o. male  past medical history significant for coronary artery disease status post PTCA and stenting done long time ago, last evaluation of coronary artery was done in May 2023 in form of stress test which was negative, abdominal arctic aneurysm measuring less than 4 cm, essential hypertension, dyslipidemia.  Comes today to months for follow-up like always he got a lot of complaint complained of being weak tired exhausted he wakes up in the morning he is tired he said he is sitting in the chair he is tired at the same time he goes twice a day for walk about 13 to 14 minutes after that have to sleep.  Denies have any chest pain tightness squeezing pressure burning chest  Past Medical History:  Diagnosis Date   Abdominal aortic aneurysm (AAA), 30-34 mm diameter (HCC) 05/17/2015   Chronic constipation 11/01/2015   Coronary artery stenosis 05/16/2015   Cortical age-related cataract of both eyes 04/15/2018   Diarrhea 07/27/2018   Dyslipidemia 05/17/2015   Essential hypertension 05/17/2015   GAD (generalized anxiety disorder) 09/14/2019   Gastroesophageal reflux disease without esophagitis 10/25/2016   Glenohumeral arthritis 07/07/2014   History of colon polyps 11/01/2015   Hyperlipidemia    Hypertension    Lymphocytic colitis 10/28/2011   Memory loss 05/03/2020   Other spondylosis with radiculopathy, lumbar region 05/10/2018   Peripheral vascular disease (HCC) 05/17/2015   Sacroiliac joint pain 05/10/2018   Senile nuclear sclerosis 04/15/2018   Formatting of this note might be different from the original. Added automatically from request for surgery [id number]   Sepsis (HCC)  10/25/2016   Spinal stenosis of lumbar region with neurogenic claudication 02/08/2018   Vitamin B12 deficiency 05/13/2019   Weakness 07/21/2017    Past Surgical History:  Procedure Laterality Date   SHOULDER SURGERY      Current Medications: Current Meds  Medication Sig   acetaminophen (TYLENOL) 500 MG tablet Take 500 mg by mouth as needed for moderate pain.   ALPRAZolam (XANAX) 0.25 MG tablet Take 1 tablet (0.25 mg total) by mouth as needed for anxiety.   amLODipine (NORVASC) 2.5 MG tablet TAKE 1 TABLET(2.5 MG) BY MOUTH DAILY (Patient taking differently: Take 2.5 mg by mouth daily.)   aspirin EC 81 MG tablet Take 81 mg by mouth daily.   carvedilol (COREG) 3.125 MG tablet Take 1 tablet by mouth daily. Patient decrease from 1 bid to 1 qd   clopidogrel (PLAVIX) 75 MG tablet Take 75 mg by mouth daily.   DULoxetine (CYMBALTA) 20 MG capsule Take 20 mg by mouth daily.   lisinopril (ZESTRIL) 2.5 MG tablet TAKE 1 TABLET(2.5 MG) BY MOUTH DAILY (Patient taking differently: Take 2.5 mg by mouth daily. TAKE 1 TABLET(2.5 MG) BY MOUTH DAILY)   omeprazole (PRILOSEC) 20 MG capsule Take 1 capsule (20 mg total) by mouth daily.   pregabalin (LYRICA) 50 MG capsule Take 50 mg by mouth 2 (two) times daily.   rosuvastatin (CRESTOR) 40 MG tablet Take 40 mg by mouth daily.   vitamin B-12 (CYANOCOBALAMIN) 1000 MCG tablet Take 1,000 mcg by mouth daily.   VITAMIN D PO Take  1 tablet by mouth daily. Unknown strength     Allergies:   Penicillins, Ciprofloxacin, Duloxetine, Enalapril, Metaxalone, and Niacin   Social History   Socioeconomic History   Marital status: Married    Spouse name: Not on file   Number of children: Not on file   Years of education: Not on file   Highest education level: Not on file  Occupational History   Not on file  Tobacco Use   Smoking status: Former    Passive exposure: Past   Smokeless tobacco: Never  Vaping Use   Vaping status: Never Used  Substance and Sexual Activity    Alcohol use: Yes   Drug use: No   Sexual activity: Not on file  Other Topics Concern   Not on file  Social History Narrative   Not on file   Social Drivers of Health   Financial Resource Strain: Not on file  Food Insecurity: Not on file  Transportation Needs: Not on file  Physical Activity: Not on file  Stress: Not on file  Social Connections: Not on file     Family History: The patient's family history includes Cancer in his father; Colon cancer in his father. ROS:   Please see the history of present illness.    All 14 point review of systems negative except as described per history of present illness  EKGs/Labs/Other Studies Reviewed:         Recent Labs: No results found for requested labs within last 365 days.  Recent Lipid Panel No results found for: "CHOL", "TRIG", "HDL", "CHOLHDL", "VLDL", "LDLCALC", "LDLDIRECT"  Physical Exam:    VS:  BP 120/74 (BP Location: Left Arm, Patient Position: Sitting)   Pulse 85   Ht 5\' 7"  (1.702 m)   Wt 166 lb (75.3 kg)   SpO2 96%   BMI 26.00 kg/m     Wt Readings from Last 3 Encounters:  11/27/23 166 lb (75.3 kg)  03/02/23 167 lb (75.8 kg)  10/29/22 172 lb 1.3 oz (78.1 kg)     GEN:  Well nourished, well developed in no acute distress HEENT: Normal NECK: No JVD; No carotid bruits LYMPHATICS: No lymphadenopathy CARDIAC: RRR, no murmurs, no rubs, no gallops RESPIRATORY:  Clear to auscultation without rales, wheezing or rhonchi  ABDOMEN: Soft, non-tender, non-distended MUSCULOSKELETAL:  No edema; No deformity  SKIN: Warm and dry LOWER EXTREMITIES: no swelling NEUROLOGIC:  Alert and oriented x 3 PSYCHIATRIC:  Normal affect   ASSESSMENT:    1. Weakness   2. Dyspnea on exertion   3. Coronary artery stenosis   4. Abdominal aortic aneurysm (AAA), 30-34 mm diameter (HCC)   5. Essential hypertension   6. Type 2 diabetes mellitus with peripheral vascular disease (HCC)   7. Mixed hyperlipidemia    PLAN:    In order of  problems listed above:  Coronary disease stable from that point review on appropriate guideline directed medical therapy. Abdominal tach aneurysm is stable we will continue monitoring. Essential hypertension blood pressure well-controlled. Profound weakness fatigue check TSH and complete metabolic panel   Medication Adjustments/Labs and Tests Ordered: Current medicines are reviewed at length with the patient today.  Concerns regarding medicines are outlined above.  Orders Placed This Encounter  Procedures   Comprehensive metabolic panel   TSH   Medication changes: No orders of the defined types were placed in this encounter.   Signed, Georgeanna Lea, MD, [facility] 11/27/2023 3:52 PM    [facility]

## 2023-11-27 NOTE — Patient Instructions (Signed)
Medication Instructions:  Your physician recommends that you continue on your current medications as directed. Please refer to the Current Medication list given to you today.  *If you need a refill on your cardiac medications before your next appointment, please call your pharmacy*   Lab Work: CMP, TSH- today If you have labs (blood work) drawn today and your tests are completely normal, you will receive your results only by: MyChart Message (if you have MyChart) OR A paper copy in the mail If you have any lab test that is abnormal or we need to change your treatment, we will call you to review the results.   Testing/Procedures: None Ordered   Follow-Up: At [facility], you and your health needs are our priority.  As part of our continuing mission to provide you with exceptional heart care, we have created designated Provider Care Teams.  These Care Teams include your primary Cardiologist (physician) and Advanced Practice Providers (APPs -  Physician Assistants and Nurse Practitioners) who all work together to provide you with the care you need, when you need it.  We recommend signing up for the patient portal called "MyChart".  Sign up information is provided on this After Visit Summary.  MyChart is used to connect with patients for Virtual Visits (Telemedicine).  Patients are able to view lab/test results, encounter notes, upcoming appointments, etc.  Non-urgent messages can be sent to your provider as well.   To learn more about what you can do with MyChart, go to [email or web address].    Your next appointment:   6 month(s)  The format for your next appointment:   In Person  Provider:   Gypsy Balsam, MD    Other Instructions NA

## 2023-11-28 LAB — COMPREHENSIVE METABOLIC PANEL
ALT: 15 [IU]/L (ref 0–44)
AST: 18 [IU]/L (ref 0–40)
Albumin: 4.6 g/dL (ref 3.7–4.7)
Alkaline Phosphatase: 74 [IU]/L (ref 44–121)
BUN/Creatinine Ratio: 17 (ref 10–24)
BUN: 19 mg/dL (ref 8–27)
Bilirubin Total: 0.3 mg/dL (ref 0.0–1.2)
CO2: 24 mmol/L (ref 20–29)
Calcium: 9.6 mg/dL (ref 8.6–10.2)
Chloride: 101 mmol/L (ref 96–106)
Creatinine, Ser: 1.12 mg/dL (ref 0.76–1.27)
Globulin, Total: 2.3 g/dL (ref 1.5–4.5)
Glucose: 150 mg/dL — ABNORMAL HIGH (ref 70–99)
Potassium: 5 mmol/L (ref 3.5–5.2)
Sodium: 140 mmol/L (ref 134–144)
Total Protein: 6.9 g/dL (ref 6.0–8.5)
eGFR: 63 mL/min/{1.73_m2} (ref >59)

## 2023-11-28 LAB — TSH: TSH: 1.88 u[IU]/mL (ref 0.450–4.500)

## 2023-12-02 ENCOUNTER — Telehealth: Payer: Self-pay

## 2023-12-02 NOTE — Telephone Encounter (Signed)
-----   Message from Ralene Burger sent at 11/30/2023 10:03 AM EST ----- Mild laboratory test came fine

## 2023-12-02 NOTE — Telephone Encounter (Signed)
 Patient notified through my chart.

## 2023-12-10 DIAGNOSIS — F321 Major depressive disorder, single episode, moderate: Secondary | ICD-10-CM | POA: Insufficient documentation

## 2024-01-07 ENCOUNTER — Other Ambulatory Visit: Payer: Self-pay | Admitting: Cardiology

## 2024-11-14 ENCOUNTER — Encounter: Payer: Self-pay | Admitting: *Deleted

## 2024-11-15 ENCOUNTER — Ambulatory Visit: Attending: Cardiology | Admitting: Cardiology

## 2024-11-15 ENCOUNTER — Encounter: Payer: Self-pay | Admitting: Cardiology

## 2024-11-15 VITALS — BP 140/70 | HR 69 | Ht 67.0 in | Wt 164.0 lb

## 2024-11-15 DIAGNOSIS — R0609 Other forms of dyspnea: Secondary | ICD-10-CM | POA: Diagnosis not present

## 2024-11-15 DIAGNOSIS — I10 Essential (primary) hypertension: Secondary | ICD-10-CM

## 2024-11-15 DIAGNOSIS — I2510 Atherosclerotic heart disease of native coronary artery without angina pectoris: Secondary | ICD-10-CM

## 2024-11-15 DIAGNOSIS — I714 Abdominal aortic aneurysm, without rupture, unspecified: Secondary | ICD-10-CM | POA: Diagnosis not present

## 2024-11-15 DIAGNOSIS — E1151 Type 2 diabetes mellitus with diabetic peripheral angiopathy without gangrene: Secondary | ICD-10-CM

## 2024-11-15 DIAGNOSIS — E782 Mixed hyperlipidemia: Secondary | ICD-10-CM | POA: Diagnosis not present

## 2024-11-15 NOTE — Progress Notes (Signed)
 " Cardiology Office Note:    Date:  11/15/2024   ID:  Stanley Cain, DOB [birth year], MRN [id number]  PCP:  Delilah Murray HERO., MD  Cardiologist:  Lamar Fitch, MD    Referring MD: Delilah Murray HERO., MD   Chief Complaint  Patient presents with   Follow-up    History of Present Illness:     Stanley Cain is a 89 y.o. male complex past medical history which include coronary artery disease status post PTCA and stenting done long time ago, last evaluation of coronary artery disease was done in May 2023 and from a stress test which was negative, abdominal aortic aneurysm measuring less than 4 cm, essential hypertension, dyslipidemia.  Comes today to months for follow-up like always with his wife.  He is complaining of having a lot of fatigue tiredness.  He still try to walk on the regular basis but get very tired and walk much less than before.  He is going to be 89 in few days and that will be also 61st anniversary with his wife.  Past Medical History:  Diagnosis Date   Abdominal aortic aneurysm (AAA), 30-34 mm diameter 05/17/2015   Chronic constipation 11/01/2015   Coronary artery stenosis 05/16/2015   Cortical age-related cataract of both eyes 04/15/2018   Diarrhea 07/27/2018   Dyslipidemia 05/17/2015   Essential hypertension 05/17/2015   GAD (generalized anxiety disorder) 09/14/2019   Gastroesophageal reflux disease without esophagitis 10/25/2016   Glenohumeral arthritis 07/07/2014   History of colon polyps 11/01/2015   Hyperlipidemia    Hypertension    Lymphocytic colitis 10/28/2011   Memory loss 05/03/2020   Other spondylosis with radiculopathy, lumbar region 05/10/2018   Peripheral vascular disease 05/17/2015   Sacroiliac joint pain 05/10/2018   Senile nuclear sclerosis 04/15/2018   Formatting of this note might be different from the original. Added automatically from request for surgery [id number]   Sepsis (HCC) 10/25/2016   Spinal stenosis of lumbar region with neurogenic claudication  02/08/2018   Vitamin B12 deficiency 05/13/2019   Weakness 07/21/2017    Past Surgical History:  Procedure Laterality Date   SHOULDER SURGERY      Current Medications: Active Medications[1]   Allergies:   Penicillins, Ciprofloxacin, Duloxetine, Enalapril, Metaxalone, and Niacin   Social History   Socioeconomic History   Marital status: Married    Spouse name: Not on file   Number of children: Not on file   Years of education: Not on file   Highest education level: Not on file  Occupational History   Not on file  Tobacco Use   Smoking status: Former    Passive exposure: Past   Smokeless tobacco: Never  Vaping Use   Vaping status: Never Used  Substance and Sexual Activity   Alcohol use: Yes   Drug use: No   Sexual activity: Not on file  Other Topics Concern   Not on file  Social History Narrative   Not on file   Social Drivers of Health   Tobacco Use: Medium Risk (11/15/2024)   Patient History    Smoking Tobacco Use: Former    Smokeless Tobacco Use: Never    Passive Exposure: Past  Physicist, Medical Strain: Not on file  Food Insecurity: Low Risk (08/13/2024)   Received from [facility]   Epic    Within the past 12 months, you worried that your food would run out before you got money to buy more: Never true    Within the past 12 months, the food  you bought just didn't last and you didn't have money to get more. : Never true  Transportation Needs: No Transportation Needs (08/13/2024)   Received from [employer]    In the past 12 months, has lack of reliable transportation kept you from medical appointments, meetings, work or from getting things needed for daily living? : No  Physical Activity: Not on file  Stress: Not on file  Social Connections: Not on file  Depression (EYV7-0): Not on file  Alcohol Screen: Not on file  Housing: Low Risk (08/13/2024)   Received from [facility]   Epic    What is your living situation today?: I have a  steady place to live    Think about the place you live. Do you have problems with any of the following? Choose all that apply:: None/None on this list  Utilities: Low Risk (08/13/2024)   Received from [facility]   Utilities    In the past 12 months has the electric, gas, oil, or water company threatened to shut off services in your home? : No  Health Literacy: Not on file     Family History: The patient's family history includes Cancer in his father; Colon cancer in his father. ROS:   Please see the history of present illness.    All 14 point review of systems negative except as described per history of present illness  EKGs/Labs/Other Studies Reviewed:    EKG Interpretation Date/Time:  Tuesday November 15 2024 10:11:01 EST Ventricular Rate:  69 PR Interval:  196 QRS Duration:  84 QT Interval:  372 QTC Calculation: 398 R Axis:   75  Text Interpretation: Normal sinus rhythm Normal ECG No previous ECGs available Confirmed by Bernie Charleston [phone]) on 11/15/2024 10:15:27 AM    Recent Labs: 11/27/2023: ALT 15; BUN 19; Creatinine, Ser 1.12; Potassium 5.0; Sodium 140; TSH 1.880  Recent Lipid Panel No results found for: CHOL, TRIG, HDL, CHOLHDL, VLDL, LDLCALC, LDLDIRECT  Physical Exam:    VS:  BP (!) 140/70   Pulse 69   Ht 5' 7 (1.702 m)   Wt 164 lb (74.4 kg)   SpO2 96%   BMI 25.69 kg/m     Wt Readings from Last 3 Encounters:  11/15/24 164 lb (74.4 kg)  11/27/23 166 lb (75.3 kg)  03/02/23 167 lb (75.8 kg)     GEN:  Well nourished, well developed in no acute distress HEENT: Normal NECK: No JVD; No carotid bruits LYMPHATICS: No lymphadenopathy CARDIAC: RRR, no murmurs, no rubs, no gallops RESPIRATORY:  Clear to auscultation without rales, wheezing or rhonchi  ABDOMEN: Soft, non-tender, non-distended MUSCULOSKELETAL:  No edema; No deformity  SKIN: Warm and dry LOWER EXTREMITIES: no swelling NEUROLOGIC:  Alert and oriented x 3 PSYCHIATRIC:  Normal  affect   ASSESSMENT:    1. Essential hypertension   2. Coronary artery stenosis   3. Type 2 diabetes mellitus with peripheral vascular disease (HCC)   4. Mixed hyperlipidemia    PLAN:    In order of problems listed above:  Essential hypertension blood pressure well-controlled continue present management. Coronary artery disease status post PTCA and stenting asymptomatic no chest pain tightness squeezing pressure burning chest. Mixed dyslipidemia, he is taking Crestor 40 and I do not have any recent fasting lipid profile will call primary care physician to get a copy of it. Type 2 diabetes apparently stable. Weakness and fatigue will schedule him to have echocardiogram. Abdominal arctic aneurysm will do another abdominal ultrasound  Medication Adjustments/Labs and Tests Ordered: Current medicines are reviewed at length with the patient today.  Concerns regarding medicines are outlined above.  Orders Placed This Encounter  Procedures   EKG 12-Lead   Medication changes: No orders of the defined types were placed in this encounter.   Signed, Lamar DOROTHA Fitch, MD, [facility] 11/15/2024 10:31 AM    [facility]    [1]  Current Meds  Medication Sig   acetaminophen (TYLENOL) 500 MG tablet Take 500 mg by mouth as needed for moderate pain.   ALPRAZolam  (XANAX ) 0.25 MG tablet Take 1 tablet (0.25 mg total) by mouth as needed for anxiety.   amLODipine  (NORVASC ) 2.5 MG tablet TAKE 1 TABLET(2.5 MG) BY MOUTH DAILY   aspirin EC 81 MG tablet Take 81 mg by mouth daily.   carvedilol  (COREG ) 3.125 MG tablet Take 1 tablet by mouth daily. Patient decrease from 1 bid to 1 qd   clopidogrel (PLAVIX) 75 MG tablet Take 75 mg by mouth daily.   DULoxetine (CYMBALTA) 20 MG capsule Take 20 mg by mouth daily.   lisinopril  (ZESTRIL ) 2.5 MG tablet TAKE 1 TABLET(2.5 MG) BY MOUTH DAILY (Patient taking differently: Take 2.5 mg by mouth daily. TAKE 1 TABLET(2.5 MG) BY MOUTH DAILY)    omeprazole  (PRILOSEC) 20 MG capsule Take 1 capsule (20 mg total) by mouth daily.   pregabalin (LYRICA) 50 MG capsule Take 50 mg by mouth 2 (two) times daily.   rosuvastatin (CRESTOR) 40 MG tablet Take 40 mg by mouth daily.   vitamin B-12 (CYANOCOBALAMIN) 1000 MCG tablet Take 1,000 mcg by mouth daily.   VITAMIN D  PO Take 1 tablet by mouth daily. Unknown strength   "

## 2024-11-15 NOTE — Patient Instructions (Signed)
 Medication Instructions:  Your physician recommends that you continue on your current medications as directed. Please refer to the Current Medication list given to you today.  *If you need a refill on your cardiac medications before your next appointment, please call your pharmacy*   Lab Work: None Ordered If you have labs (blood work) drawn today and your tests are completely normal, you will receive your results only by: MyChart Message (if you have MyChart) OR A paper copy in the mail If you have any lab test that is abnormal or we need to change your treatment, we will call you to review the results.   Testing/Procedures:  Your physician has requested that you have an abdominal aorta duplex. During this test, an ultrasound is used to evaluate the aorta. Allow 30 minutes for this exam. Do not eat after midnight the day before and avoid carbonated beverages.  Please note: We ask at that you not bring children with you during ultrasound (echo/ vascular) testing. Due to room size and safety concerns, children are not allowed in the ultrasound rooms during exams. Our front office staff cannot provide observation of children in our lobby area while testing is being conducted. An adult accompanying a patient to their appointment will only be allowed in the ultrasound room at the discretion of the ultrasound technician under special circumstances. We apologize for any inconvenience.  Your physician has requested that you have an echocardiogram. Echocardiography is a painless test that uses sound waves to create images of your heart. It provides your doctor with information about the size and shape of your heart and how well your heart's chambers and valves are working. This procedure takes approximately one hour. There are no restrictions for this procedure. Please do NOT wear cologne, perfume, aftershave, or lotions (deodorant is allowed). Please arrive 15 minutes prior to your appointment  time.  Please note: We ask at that you not bring children with you during ultrasound (echo/ vascular) testing. Due to room size and safety concerns, children are not allowed in the ultrasound rooms during exams. Our front office staff cannot provide observation of children in our lobby area while testing is being conducted. An adult accompanying a patient to their appointment will only be allowed in the ultrasound room at the discretion of the ultrasound technician under special circumstances. We apologize for any inconvenience.    Follow-Up: At [facility], you and your health needs are our priority.  As part of our continuing mission to provide you with exceptional heart care, we have created designated Provider Care Teams.  These Care Teams include your primary Cardiologist (physician) and Advanced Practice Providers (APPs -  Physician Assistants and Nurse Practitioners) who all work together to provide you with the care you need, when you need it.  We recommend signing up for the patient portal called MyChart.  Sign up information is provided on this After Visit Summary.  MyChart is used to connect with patients for Virtual Visits (Telemedicine).  Patients are able to view lab/test results, encounter notes, upcoming appointments, etc.  Non-urgent messages can be sent to your provider as well.   To learn more about what you can do with MyChart, go to [email or web address].    Your next appointment:   6 month(s)  The format for your next appointment:   In Person  Provider:   Lamar Fitch, MD    Other Instructions NA

## 2024-11-15 NOTE — Addendum Note (Signed)
 Addended by: ARLOA MALLORY D on: 11/15/2024 10:52 AM   Modules accepted: Orders

## 2024-12-06 ENCOUNTER — Ambulatory Visit (HOSPITAL_BASED_OUTPATIENT_CLINIC_OR_DEPARTMENT_OTHER)

## 2024-12-12 ENCOUNTER — Ambulatory Visit (HOSPITAL_BASED_OUTPATIENT_CLINIC_OR_DEPARTMENT_OTHER)
